# Patient Record
Sex: Female | Born: 1985 | Race: Black or African American | Hispanic: No | Marital: Single | Smoking: Current every day smoker
Health system: Southern US, Community
[De-identification: ages and names within clinical notes are randomized; demographics above are authoritative.]

## PROBLEM LIST (undated history)

## (undated) ENCOUNTER — Emergency Department (HOSPITAL_COMMUNITY): Admission: EM | Payer: Self-pay | Source: Home / Self Care

## (undated) DIAGNOSIS — I10 Essential (primary) hypertension: Secondary | ICD-10-CM

## (undated) DIAGNOSIS — Q211 Atrial septal defect, unspecified: Secondary | ICD-10-CM

## (undated) DIAGNOSIS — F419 Anxiety disorder, unspecified: Secondary | ICD-10-CM

## (undated) DIAGNOSIS — F319 Bipolar disorder, unspecified: Secondary | ICD-10-CM

## (undated) HISTORY — DX: Atrial septal defect, unspecified: Q21.10

## (undated) HISTORY — DX: Atrial septal defect: Q21.1

## (undated) HISTORY — PX: ASD REPAIR: SHX258

---

## 2009-03-28 ENCOUNTER — Encounter: Payer: Self-pay | Admitting: Cardiology

## 2009-03-28 ENCOUNTER — Ambulatory Visit: Payer: Self-pay | Admitting: Cardiology

## 2009-04-03 ENCOUNTER — Ambulatory Visit: Payer: Self-pay | Admitting: Cardiology

## 2009-04-03 ENCOUNTER — Encounter: Payer: Self-pay | Admitting: Cardiology

## 2009-04-03 ENCOUNTER — Ambulatory Visit (HOSPITAL_COMMUNITY): Admission: RE | Admit: 2009-04-03 | Discharge: 2009-04-03 | Payer: Self-pay | Admitting: Cardiology

## 2009-04-06 ENCOUNTER — Encounter: Payer: Self-pay | Admitting: Cardiology

## 2009-04-06 ENCOUNTER — Ambulatory Visit: Payer: Self-pay | Admitting: Cardiovascular Disease

## 2009-04-06 ENCOUNTER — Ambulatory Visit (HOSPITAL_COMMUNITY): Admission: RE | Admit: 2009-04-06 | Discharge: 2009-04-06 | Payer: Self-pay | Admitting: Cardiology

## 2009-04-18 DIAGNOSIS — Z8679 Personal history of other diseases of the circulatory system: Secondary | ICD-10-CM

## 2009-04-24 ENCOUNTER — Ambulatory Visit: Payer: Self-pay | Admitting: Cardiology

## 2010-04-02 ENCOUNTER — Telehealth (INDEPENDENT_AMBULATORY_CARE_PROVIDER_SITE_OTHER): Payer: Self-pay

## 2010-12-10 NOTE — Progress Notes (Signed)
Summary: Pre-Medicated for Dental Work  Phone Note Call from Patient   Caller: Patient Summary of Call: pt needs someone to call her dentist [facility] @ [phone]) to let them know if she needs to be pre-medicated prior to dental procedure/tg Initial call taken by: Raechel Ache [facility],  Apr 02, 2010 10:47 AM  Follow-up for Phone Call        Will pt. need to be pre-medicated before dental procedures. She has HX of possible ASD repair at age 25. Please advise. Follow-up by: Larita Fife Via LPN,  Apr 02, 2010 2:30 PM  Additional Follow-up for Phone Call Additional follow up Details #1::        Yes. Additional Follow-up by: Gaylord Shih, MD, [facility],  Apr 02, 2010 5:25 PM     Appended Document: Pre-Medicated for Dental Work    Phone Note Outgoing Call   Summary of Call: Pt. and [facility] notified that pt. does need to be medicated before dental procedures.

## 2011-03-25 NOTE — Assessment & Plan Note (Signed)
[facility]                       [place] CARDIOLOGY OFFICE NOTE   NAME:Metheny, ASHLEN KIGER                     MRN:          [id number]  DATE:04/24/2009                            DOB:          02-14-1986    CARDIOLOGIST:  Dr. Valera Castle   OBSTETRICIAN:  Dr. Ruthy Dick   REASON FOR VISIT:  Follow-up on abnormal echocardiogram.   HISTORY OF PRESENT ILLNESS:  Ms. Hilmes is a 25 year old female patient  at about 6 weeks of gestation who saw Dr. Daleen Squibb initially on Mar 28, 2009 for follow-up on possible atrial septal defect repair at age [age].  She had an echocardiogram performed.  This demonstrated EF of 50-55%.  She had slightly paradoxical motion of the proximal ventricular septum.  There was also possible modest flow across the mid atrial septum.  Her  peak pulmonary pressure was 32 mmHg.  She was set up for a follow-up  echocardiogram with saline contrast.  This was read by Dr. Eden Emms and  demonstrated no evidence of right-to-left shunt.  She returns in follow-  up.  She is overall doing well.  She has occasional dyspepsia.  She  denies any syncope.  She has some slight shortness of breath with  exertion.  She denies orthopnea or PND.   CURRENT MEDICATIONS:  Ferrous sulfate 325 mg daily.   PHYSICAL EXAMINATION:  GENERAL:  She is a well-nourished well-developed  female at [redacted] weeks gestation.  VITAL SIGNS:  Blood pressure is 118/66, pulse 64, weight 203 pounds.  HEENT:  Normal.  NECK:  Without JVD.  CARDIAC:  S1, S2, regular rate and rhythm with occasional premature  beats.  LUNGS:  Clear to auscultation bilaterally.  ABDOMEN:  Notable for intrauterine pregnancy.  EXTREMITIES:  Without edema.   STUDIES:  Rhythm strip sinus rhythm with heart rate of 93, occasional  PACs.   ASSESSMENT/PLAN:  A 25 year old female with a history of possible ASD  repair at age 78 with recent saline contrast echocardiogram demonstrating  no right-to-left  shunt.  She requires no  further cardiac workup at this time.  She was also interviewed and  examined by Dr. Daleen Squibb.  She can follow up with cardiology on a p.r.n.  basis.      Tereso Newcomer, PA-C  Electronically Signed      Jesse Sans. Daleen Squibb, MD, [facility]  Electronically Signed   SW/MedQ  DD: 04/24/2009  DT: 04/24/2009  Job #: [id number]   cc:   Ruthy Dick

## 2011-03-25 NOTE — Assessment & Plan Note (Signed)
[facility]                       [place] CARDIOLOGY OFFICE NOTE   NAME:Jamie Huber, Jamie Huber                       MRN:          [id number]  DATE:03/28/2009                            DOB:          March 01, 1986    CHIEF COMPLAINT:  Need a cardiac evaluation for a history of surgery at  25 years of age.   HISTORY OF PRESENT ILLNESS:  Jamie Huber is a 25 year old [place]  American female who comes in today for the evaluation of the above  concern.   She is [redacted] weeks pregnant.  She has had a very uneventful pregnancy thus  far with no problems with her blood pressure, no palpitations or  irregularity to her heart rhythm, no lower extremity edema, and no  significant shortness of breath or dyspnea.   She gives a history of having a hole closed in her heart at 25 years of  age at [facility].  I have no operative report or  details.  She cannot remember if it was an atrial septal defect,  ventricular septal defect, or some sort of valve defect.   She has had no problems since then.  She had a normal childhood and had  no problems keeping up with her peers physically.   Her past medical history significant for no known drug allergies.  She  is currently on vitamins with iron.  She does not smoke or drink.   She has had no repeated hospitalizations or any other surgeries.   Socially, she is a Consulting civil engineer.  She is pregnant as mentioned above.  She is  not married.   FAMILY HISTORY:  Father, mother, and brother are alive and well.   REVIEW OF SYSTEMS:  She does wear eye glasses.  She has had some dental  repair, has a couple of cavities at present.  She does not wear any  false teeth.  She has occasional palpitations but these are rare.  Every  other review of systems are negative.  Please refer our diagnostic  evaluation form.   PHYSICAL EXAMINATION:  GENERAL:  Pleasant overweight black female in no  acute distress.  She is alert and  oriented x3.  VITAL SIGNS:  She 5 feet 7 inches, weighs 196 pounds.  Her blood is  pressure is 122/78 in the left arm, pulse is 88 and regular.  Her  electrocardiogram shows sinus rhythm with diffuse T-wave inversion  across the anterior septum and lateral leads.  Her PR, QRS, and QTs are  normal.  She is in sinus rhythm.  SKIN:  Warm and dry.  Unremarkable.  HEENT:  Normal.  NECK:  Supple.  Carotid upstrokes were equal bilaterally without bruits.  No JVD.  Thyroid is not enlarged.  Trachea is midline.  CHEST:  Lungs are clear to auscultation and percussion.  Heart reveals a  nondisplaced PMI.  She has a normal S1 and S2.  No obvious murmur, rub,  or gallop.  There is no right ventricular lift.  ABDOMEN:  Soft, good bowel sounds.  No midline bruit.  No hepatomegaly.  EXTREMITIES:  There were no cyanosis,  clubbing, or edema.  Pulses are  intact.  NEUROLOGIC:  Intact.   ASSESSMENT AND PLAN:  Ms. Lenis sounds like she probably had an atrial  septal defect or vascular septal defect repair.  It would probably be  impossible to get her medical records from [facility] having done such a long  time ago.  I think for now we need to do a 2-D echocardiogram to rule  out any structural heart disease or chamber enlargement.  We will also  look to see if she has any shunt.  If she does not, and is normal, we  will see her back on a p.r.n. basis.     Thomas C. Daleen Squibb, MD, [facility]  Electronically Signed    TCW/MedQ  DD: 03/28/2009  DT: 03/29/2009  Job #: [id number]   cc:   Ruthy Dick

## 2011-11-05 ENCOUNTER — Encounter: Payer: Self-pay | Admitting: Cardiology

## 2014-08-01 ENCOUNTER — Encounter (HOSPITAL_COMMUNITY): Payer: Self-pay | Admitting: Emergency Medicine

## 2014-08-01 ENCOUNTER — Emergency Department (HOSPITAL_COMMUNITY)
Admission: EM | Admit: 2014-08-01 | Discharge: 2014-08-02 | Disposition: A | Discharge status: Other Acute Inpatient Hospital | Payer: Self-pay | Attending: Emergency Medicine | Admitting: Emergency Medicine

## 2014-08-01 DIAGNOSIS — F319 Bipolar disorder, unspecified: Secondary | ICD-10-CM | POA: Insufficient documentation

## 2014-08-01 DIAGNOSIS — F29 Unspecified psychosis not due to a substance or known physiological condition: Secondary | ICD-10-CM | POA: Insufficient documentation

## 2014-08-01 DIAGNOSIS — R4689 Other symptoms and signs involving appearance and behavior: Secondary | ICD-10-CM

## 2014-08-01 DIAGNOSIS — F17200 Nicotine dependence, unspecified, uncomplicated: Secondary | ICD-10-CM | POA: Insufficient documentation

## 2014-08-01 DIAGNOSIS — F331 Major depressive disorder, recurrent, moderate: Secondary | ICD-10-CM

## 2014-08-01 HISTORY — DX: Bipolar disorder, unspecified: F31.9

## 2014-08-01 HISTORY — DX: Anxiety disorder, unspecified: F41.9

## 2014-08-01 LAB — COMPREHENSIVE METABOLIC PANEL
ALT: 14 U/L (ref 0–35)
ANION GAP: 10 (ref 5–15)
AST: 13 U/L (ref 0–37)
Albumin: 4.3 g/dL (ref 3.5–5.2)
Alkaline Phosphatase: 64 U/L (ref 39–117)
BILIRUBIN TOTAL: 0.3 mg/dL (ref 0.3–1.2)
BUN: 6 mg/dL (ref 6–23)
CHLORIDE: 104 meq/L (ref 96–112)
CO2: 29 meq/L (ref 19–32)
CREATININE: 0.81 mg/dL (ref 0.50–1.10)
Calcium: 9.3 mg/dL (ref 8.4–10.5)
GFR calc Af Amer: >90 mL/min (ref >90)
Glucose, Bld: 103 mg/dL — ABNORMAL HIGH (ref 70–99)
Potassium: 4.3 mEq/L (ref 3.7–5.3)
Sodium: 143 mEq/L (ref 137–147)
Total Protein: 7.6 g/dL (ref 6.0–8.3)

## 2014-08-01 LAB — CBC WITH DIFFERENTIAL/PLATELET
BASOS PCT: 0 % (ref 0–1)
Basophils Absolute: 0 10*3/uL (ref 0.0–0.1)
EOS PCT: 0 % (ref 0–5)
Eosinophils Absolute: 0 10*3/uL (ref 0.0–0.7)
HEMATOCRIT: 38.8 % (ref 36.0–46.0)
HEMOGLOBIN: 13.3 g/dL (ref 12.0–15.0)
LYMPHS PCT: 32 % (ref 12–46)
Lymphs Abs: 2.5 10*3/uL (ref 0.7–4.0)
MCH: 30.6 pg (ref 26.0–34.0)
MCHC: 34.3 g/dL (ref 30.0–36.0)
MCV: 89.4 fL (ref 78.0–100.0)
MONO ABS: 0.5 10*3/uL (ref 0.1–1.0)
MONOS PCT: 6 % (ref 3–12)
NEUTROS ABS: 4.7 10*3/uL (ref 1.7–7.7)
Neutrophils Relative %: 62 % (ref 43–77)
Platelets: 180 10*3/uL (ref 150–400)
RBC: 4.34 MIL/uL (ref 3.87–5.11)
RDW: 11.7 % (ref 11.5–15.5)
WBC: 7.8 10*3/uL (ref 4.0–10.5)

## 2014-08-01 LAB — URINALYSIS, ROUTINE W REFLEX MICROSCOPIC
Bilirubin Urine: NEGATIVE
Glucose, UA: NEGATIVE mg/dL
Hgb urine dipstick: NEGATIVE
Ketones, ur: NEGATIVE mg/dL
LEUKOCYTES UA: NEGATIVE
NITRITE: NEGATIVE
PH: 6 (ref 5.0–8.0)
Protein, ur: NEGATIVE mg/dL
SPECIFIC GRAVITY, URINE: 1.025 (ref 1.005–1.030)
Urobilinogen, UA: 0.2 mg/dL (ref 0.0–1.0)

## 2014-08-01 LAB — RAPID URINE DRUG SCREEN, HOSP PERFORMED
Amphetamines: NOT DETECTED
Barbiturates: NOT DETECTED
Benzodiazepines: NOT DETECTED
Cocaine: NOT DETECTED
Opiates: NOT DETECTED
Tetrahydrocannabinol: NOT DETECTED

## 2014-08-01 LAB — PREGNANCY, URINE: PREG TEST UR: NEGATIVE

## 2014-08-01 LAB — ETHANOL

## 2014-08-01 NOTE — ED Notes (Signed)
Neurosurgeon at bedside; officer placed patient in handcuffs; patient respectful to Technical sales engineer.

## 2014-08-01 NOTE — ED Provider Notes (Signed)
CSN: [id number]     Arrival date & time 08/01/14  1709 History   First MD Initiated Contact with Patient 08/01/14 1725     Chief Complaint  Patient presents with  . V70.1     (Consider location/radiation/quality/duration/timing/severity/associated sxs/prior Treatment) HPI Comments: Patient brought in by [facility] Department with IVC papers. Questionable history of bipolar disorder and noncompliance with medications. States she was found with her parents and threatened to kill them. Patient is angry over custody issues with her child. She denies any suicidal or homicidal thoughts. She denies any hallucinations. She admits to being angry with her parents. She is not taking her medications for more than a year. Denies any fever, chills, nausea vomiting. Denies any physical injury. Denies illicit drug or alcohol use.  The history is provided by the patient and the police.    Past Medical History  Diagnosis Date  . Atrial septal defect   . Manic depression   . Anxiety    Past Surgical History  Procedure Laterality Date  . Asd repair     History reviewed. No pertinent family history. History  Substance Use Topics  . Smoking status: Current Every Day Smoker  . Smokeless tobacco: Not on file  . Alcohol Use: No   OB History   Grav Para Term Preterm Abortions TAB SAB Ect Mult Living                 Review of Systems  Constitutional: Negative for fever, activity change and appetite change.  Eyes: Negative for visual disturbance.  Respiratory: Negative for cough, chest tightness and shortness of breath.   Cardiovascular: Negative for chest pain.  Gastrointestinal: Negative for nausea, vomiting and abdominal pain.  Genitourinary: Negative for dysuria and hematuria.  Musculoskeletal: Negative for arthralgias, back pain and myalgias.  Skin: Negative for rash.  Psychiatric/Behavioral: Positive for behavioral problems, dysphoric mood and agitation. Negative for suicidal ideas, sleep  disturbance and self-injury. The patient is not nervous/anxious.   A complete 10 system review of systems was obtained and all systems are negative except as noted in the HPI and PMH.      Allergies  Review of patient's allergies indicates no known allergies.  Home Medications   Prior to Admission medications   Medication Sig Start Date End Date Taking? Authorizing Provider  ferrous sulfate 325 (65 FE) MG tablet Take 325 mg by mouth daily with breakfast.     Yes Historical Provider, MD   BP 109/88  Pulse 97  Temp(Src) 98.7 F (37.1 C) (Oral)  Resp 18  Ht  (1.702 m)  Wt 185 lb (83.915 kg)  BMI 28.97 kg/m2  SpO2 100% Physical Exam  Nursing note and vitals reviewed. Constitutional: She is oriented to person, place, and time. She appears well-developed and well-nourished. No distress.  HENT:  Head: Normocephalic and atraumatic.  Mouth/Throat: Oropharynx is clear and moist. No oropharyngeal exudate.  Eyes: Conjunctivae and EOM are normal. Pupils are equal, round, and reactive to light.  Neck: Normal range of motion. Neck supple.  No meningismus.  Cardiovascular: Normal rate, regular rhythm, normal heart sounds and intact distal pulses.   No murmur heard. Pulmonary/Chest: Effort normal and breath sounds normal. No respiratory distress.  Abdominal: Soft. There is no tenderness. There is no rebound and no guarding.  Musculoskeletal: Normal range of motion. She exhibits no edema and no tenderness.  Neurological: She is alert and oriented to person, place, and time. No cranial nerve deficit. She exhibits normal muscle tone.  Coordination normal.  No ataxia on finger to nose bilaterally. No pronator drift. 5/5 strength throughout. CN 2-12 intact. Negative Romberg. Equal grip strength. Sensation intact. Gait is normal.   Skin: Skin is warm.  Psychiatric: She has a normal mood and affect. Her behavior is normal.    ED Course  Procedures (including critical care time) Labs  Review Labs Reviewed  COMPREHENSIVE METABOLIC PANEL - Abnormal; Notable for the following:    Glucose, Bld 103 (*)    All other components within normal limits  CBC WITH DIFFERENTIAL  ETHANOL  URINE RAPID DRUG SCREEN (HOSP PERFORMED)  PREGNANCY, URINE  URINALYSIS, ROUTINE W REFLEX MICROSCOPIC    Imaging Review No results found.   EKG Interpretation None      MDM   Final diagnoses:  None   patient brought in by [facility] with IVC paperwork for behavior problem and agitation. She is calm and cooperative.  Labs unremarkable. Patient stable for psychiatric evaluation and medically clear. Holding orders placed.    Glynn Octave, MD 08/01/14 2130

## 2014-08-01 NOTE — ED Notes (Signed)
Patient was closing curtain, sitter opened it and patient closed it again. I was notified and went to talk to patient and to open her curtain. She became loud and very vocal stating that the patient that was in there before had their curtain closed. I explained to her that the patient in here before her, was a medical patient. I also explained to her that our sitters needed to have her within their site. Patient states that "they can see my legs", I explained to her that was not adequate. Patient states that I was being belligerent and she was going to call my boss. I explained to her that she could not close the curtain, and we had moved her from the hallway to the room as a courtesy. Patient very argumentative. States that she is "28 years old", "we can't hold her here against her will", states that she has rights as a patient "it's your fault that I have not had my assessment", explained to patient that she was going to have a face to face evaluation. Security at bedside, they called the sheriff's department.

## 2014-08-01 NOTE — ED Notes (Signed)
Patient sent here with [facility] Department with IVC papers. Papers state patient is noncompliant with her bipolar medication. States she is violent with her parents and has threatened to kill them. Patient states this happened a year ago. States "I talk a lot of shit to my parents and they can't handle it. I can only take so much before I talk shit back to them. I don't take my medication because it makes me feel as if I have no control." Patient cooperative and calm in triage.

## 2014-08-01 NOTE — ED Notes (Signed)
Officer states pt has been very calm, Called to be released from sitting, Charge aware. Officer states seems to be a battle over parents wanting to take the custody of the child. Pt states she has to live there now and parents are trying to take custody of her child, Father's name is not on birth cert, so they want custody and not let child go into foster care. Pt states she does not take her medication because she feels like she does not them.

## 2014-08-01 NOTE — ED Notes (Signed)
Assumed care of patient at this time. Sitter at the bedside at this time. Labs and urine ordered. Pt changed into paper scrubs. Belongings searched and place in belongings bag and labels and secured in nursing station. Security at bedside to wand pt. Officer at the bedside.

## 2014-08-01 NOTE — BH Assessment (Signed)
Per Donell Sievert, PA patient meets criteria for inpatient hospitalization.  Patient accepted to [facility] Bed 300-2.  Writer informed the ER MD (Dr.Cook) and the nurse working with the patient. The nurse will arrange transportation with the [facility].

## 2014-08-02 DIAGNOSIS — F331 Major depressive disorder, recurrent, moderate: Secondary | ICD-10-CM

## 2014-08-02 NOTE — BHH Suicide Risk Assessment (Signed)
Suicide Risk Assessment  Discharge Assessment     Demographic Factors:  Low socioeconomic status and Unemployed  Total Time spent with patient: 45 minutes  Psychiatric Specialty Exam:     Blood pressure 118/68, pulse 77, temperature 99.3 F (37.4 C), temperature source Oral, resp. rate 16, height  (1.702 m), weight 83.915 kg (185 lb), SpO2 100.00%.Body mass index is 28.97 kg/(m^2).  General Appearance: Casual  Eye Contact::  Good  Speech:  Clear and Coherent  Volume:  Normal  Mood:  Irritable  Affect:  Appropriate  Thought Process:  Coherent and Logical  Orientation:  Full (Time, Place, and Person)  Thought Content:  Negative  Suicidal Thoughts:  No  Homicidal Thoughts:  No  Memory:  Immediate;   Good Recent;   Good Remote;   Good  Judgement:  Intact  Insight:  Fair  Psychomotor Activity:  Normal  Concentration:  Good  Recall:  Good  Fund of Knowledge:Good  Language: Good  Akathisia:  Negative  Handed:  Right  AIMS (if indicated):     Assets:  Communication Skills Desire for Improvement Housing Physical Health  Sleep:       Musculoskeletal: Strength & Muscle Tone: within normal limits Gait & Station: normal Patient leans: N/A   Mental Status Per Nursing Assessment::   On Admission:     Current Mental Status by Physician: NA  Loss Factors: NA  Historical Factors: NA  Risk Reduction Factors:   NA  Continued Clinical Symptoms:  Bipolar Disorder:   Depressive phase  Cognitive Features That Contribute To Risk:  none   Suicide Risk:  Minimal: No identifiable suicidal ideation.  Patients presenting with no risk factors but with morbid ruminations; may be classified as minimal risk based on the severity of the depressive symptoms  Discharge Diagnoses:   AXIS I:  Adjustment Disorder with Mixed Emotional Features AXIS II:  Deferred AXIS III:   Past Medical History  Diagnosis Date  . Atrial septal defect   . Manic depression   . Anxiety     AXIS IV:  economic problems, housing problems, other psychosocial or environmental problems and problems with primary support group AXIS V:  61-70 mild symptoms  Plan Of Care/Follow-up recommendations:  Activity:  resume usual activity Diet:  resume usual diet  Is patient on multiple antipsychotic therapies at discharge:  No   Has Patient had three or more failed trials of antipsychotic monotherapy by history:  No  Recommended Plan for Multiple Antipsychotic Therapies: NA    TAYLOR,GERALD D 08/02/2014, 12:43 PM

## 2014-08-02 NOTE — BH Assessment (Signed)
Dr. Ladona Ridgel and Assunta Found, NP recommend discharge home.  Patient is ready for discharge, however; has no transportation back to [facility] where she resides. Per patient's nurse [facility] brought patient here from [facility]. They have a responsibility to transport patient back to [facility] and agreeable to do so but not until tommorow.  Writer has made calls to LCSW [phone] to assist with this situation. Writer unable to reach a LCSW. ED secretary contacted to verify if LCSW is available to assist with getting back to [facility] so that patient doesn't have to remain in the ED overnight. Misty Stanley will contact LCSW department to assist with patient's needs.

## 2014-08-02 NOTE — ED Notes (Signed)
Bed: [facility] Expected date: 08/01/14 Expected time: 11:49 PM Means of arrival:  Comments: [facility] transfer

## 2014-08-02 NOTE — Consult Note (Signed)
[facility] Face-to-Face Psychiatry Consult   Reason for Consult:  Involuntarily committed for reportedly threatening her parents Referring Physician:  ER MD   Jamie Huber is an 28 y.o. female. Total Time spent with patient: 45 minutes  Assessment: AXIS I:  major depression recurrent moderate AXIS II:  Deferred AXIS III:   Past Medical History  Diagnosis Date  . Atrial septal defect   . Manic depression   . Anxiety    AXIS IV:  problems with primary support group AXIS V:  61-70 mild symptoms  Plan:  No evidence of imminent risk to self or others at present.    Subjective:   Jamie Huber is a 28 y.o. female patient admitted with being accused by her parents of threatening to kill them she says.  HPI:  Jamie Huber says she and her parents all have anger issues.  She admits hers but her mother in particular does not admit her problems.  They all say things to each other in anger but none plan to kill any body else.  She says she would never kill herself or anybody else.  She has a 43 year old daughter whom she loves and is trying to rear right.  She has to live with her parents right now and there is daily friction.  She says she has been diagnosed with bipolar disorder but the meds she was on did not help.  She has been in therapy and that did not help either. HPI Elements:   Location:  depression . Quality:  mainly stress not suicidal. Severity:  constant discord with her parents she has to live with for now. Timing:  arguments with parents. Duration:  months. Context:  as above.  Past Psychiatric History: Past Medical History  Diagnosis Date  . Atrial septal defect   . Manic depression   . Anxiety     reports that she has been smoking.  She does not have any smokeless tobacco history on file. She reports that she does not drink alcohol or use illicit drugs. History reviewed. No pertinent family history. Family History Substance Abuse: No Family Supports: Yes, List: Living  Arrangements: Parent Can pt return to current living arrangement?: Yes   Allergies:  No Known Allergies  ACT Assessment Complete:  Yes:    Educational Status    Risk to Self: Risk to self with the past 6 months Suicidal Ideation: No Suicidal Intent: No Is patient at risk for suicide?: No Suicidal Plan?: No Access to Means: No What has been your use of drugs/alcohol within the last 12 months?: NA Previous Attempts/Gestures: No How many times?: 0 Other Self Harm Risks: None Reported Triggers for Past Attempts:  (NA) Intentional Self Injurious Behavior: None Family Suicide History: No Recent stressful life event(s): Job Loss;Financial Problems;Conflict (Comment) (Strained relationship with her parents.) Persecutory voices/beliefs?: Yes Depression: Yes Depression Symptoms: Despondent;Fatigue;Feeling worthless/self pity;Feeling angry/irritable;Loss of interest in usual pleasures Substance abuse history and/or treatment for substance abuse?: No Suicide prevention information given to non-admitted patients: Not applicable  Risk to Others: Risk to Others within the past 6 months Homicidal Ideation: Yes-Currently Present Thoughts of Harm to Others: Yes-Currently Present Comment - Thoughts of Harm to Others: Mother reports that she threatened to kill her (Patient denies wanting to kill parents) Current Homicidal Intent: No Current Homicidal Plan: No Access to Homicidal Means: No Identified Victim: None Reported History of harm to others?: No Assessment of Violence: None Noted Violent Behavior Description: None Reported Does patient have  access to weapons?: No Criminal Charges Pending?: No Does patient have a court date: No  Abuse:    Prior Inpatient Therapy: Prior Inpatient Therapy Prior Inpatient Therapy: Yes Prior Therapy Dates: 2014 Prior Therapy Facilty/Provider(s): Unable to remember the name of the hospital Reason for Treatment: Depression  Prior Outpatient Therapy: Prior  Outpatient Therapy Prior Outpatient Therapy: No Prior Therapy Dates: NA Prior Therapy Facilty/Provider(s): NA Reason for Treatment: Medication Management and OPT  Additional Information: Additional Information 1:1 In Past 12 Months?: No CIRT Risk: No Elopement Risk: No Does patient have medical clearance?: Yes                  Objective: Blood pressure 118/68, pulse 77, temperature 99.3 F (37.4 C), temperature source Oral, resp. rate 16, height 5' 7"  (1.702 m), weight 83.915 kg (185 lb), SpO2 100.00%.Body mass index is 28.97 kg/(m^2). Results for orders placed during the hospital encounter of 08/01/14 (from the past 72 hour(s))  URINE RAPID DRUG SCREEN (HOSP PERFORMED)     Status: None   Collection Time    08/01/14  5:52 PM      Result Value Ref Range   Opiates NONE DETECTED  NONE DETECTED   Cocaine NONE DETECTED  NONE DETECTED   Benzodiazepines NONE DETECTED  NONE DETECTED   Amphetamines NONE DETECTED  NONE DETECTED   Tetrahydrocannabinol NONE DETECTED  NONE DETECTED   Barbiturates NONE DETECTED  NONE DETECTED   Comment:            DRUG SCREEN FOR MEDICAL PURPOSES     ONLY.  IF CONFIRMATION IS NEEDED     FOR ANY PURPOSE, NOTIFY LAB     WITHIN 5 DAYS.                LOWEST DETECTABLE LIMITS     FOR URINE DRUG SCREEN     Drug Class       Cutoff (ng/mL)     Amphetamine      1000     Barbiturate      200     Benzodiazepine   423     Tricyclics       536     Opiates          300     Cocaine          300     THC              50  PREGNANCY, URINE     Status: None   Collection Time    08/01/14  5:52 PM      Result Value Ref Range   Preg Test, Ur NEGATIVE  NEGATIVE  URINALYSIS, ROUTINE W REFLEX MICROSCOPIC     Status: None   Collection Time    08/01/14  5:52 PM      Result Value Ref Range   Color, Urine YELLOW  YELLOW   APPearance CLEAR  CLEAR   Specific Gravity, Urine 1.025  1.005 - 1.030   pH 6.0  5.0 - 8.0   Glucose, UA NEGATIVE  NEGATIVE mg/dL   Hgb  urine dipstick NEGATIVE  NEGATIVE   Bilirubin Urine NEGATIVE  NEGATIVE   Ketones, ur NEGATIVE  NEGATIVE mg/dL   Protein, ur NEGATIVE  NEGATIVE mg/dL   Urobilinogen, UA 0.2  0.0 - 1.0 mg/dL   Nitrite NEGATIVE  NEGATIVE   Leukocytes, UA NEGATIVE  NEGATIVE   Comment: MICROSCOPIC NOT DONE ON URINES WITH NEGATIVE PROTEIN, BLOOD, LEUKOCYTES, NITRITE, OR  GLUCOSE <1000 mg/dL.  CBC WITH DIFFERENTIAL     Status: None   Collection Time    08/01/14  6:12 PM      Result Value Ref Range   WBC 7.8  4.0 - 10.5 K/uL   RBC 4.34  3.87 - 5.11 MIL/uL   Hemoglobin 13.3  12.0 - 15.0 g/dL   HCT 38.8  36.0 - 46.0 %   MCV 89.4  78.0 - 100.0 fL   MCH 30.6  26.0 - 34.0 pg   MCHC 34.3  30.0 - 36.0 g/dL   RDW 11.7  11.5 - 15.5 %   Platelets 180  150 - 400 K/uL   Neutrophils Relative % 62  43 - 77 %   Neutro Abs 4.7  1.7 - 7.7 K/uL   Lymphocytes Relative 32  12 - 46 %   Lymphs Abs 2.5  0.7 - 4.0 K/uL   Monocytes Relative 6  3 - 12 %   Monocytes Absolute 0.5  0.1 - 1.0 K/uL   Eosinophils Relative 0  0 - 5 %   Eosinophils Absolute 0.0  0.0 - 0.7 K/uL   Basophils Relative 0  0 - 1 %   Basophils Absolute 0.0  0.0 - 0.1 K/uL  COMPREHENSIVE METABOLIC PANEL     Status: Abnormal   Collection Time    08/01/14  6:12 PM      Result Value Ref Range   Sodium 143  137 - 147 mEq/L   Potassium 4.3  3.7 - 5.3 mEq/L   Chloride 104  96 - 112 mEq/L   CO2 29  19 - 32 mEq/L   Glucose, Bld 103 (*) 70 - 99 mg/dL   BUN 6  6 - 23 mg/dL   Creatinine, Ser 0.81  0.50 - 1.10 mg/dL   Calcium 9.3  8.4 - 10.5 mg/dL   Total Protein 7.6  6.0 - 8.3 g/dL   Albumin 4.3  3.5 - 5.2 g/dL   AST 13  0 - 37 U/L   ALT 14  0 - 35 U/L   Alkaline Phosphatase 64  39 - 117 U/L   Total Bilirubin 0.3  0.3 - 1.2 mg/dL   GFR calc non Af Amer >90  >90 mL/min   GFR calc Af Amer >90  >90 mL/min   Comment: (NOTE)     The eGFR has been calculated using the CKD EPI equation.     This calculation has not been validated in all clinical situations.      eGFR's persistently <90 mL/min signify possible Chronic Kidney     Disease.   Anion gap 10  5 - 15  ETHANOL     Status: None   Collection Time    08/01/14  6:12 PM      Result Value Ref Range   Alcohol, Ethyl (B) <11  0 - 11 mg/dL   Comment:            LOWEST DETECTABLE LIMIT FOR     SERUM ALCOHOL IS 11 mg/dL     FOR MEDICAL PURPOSES ONLY   Labs are reviewed and are pertinent for no psychiatric issues.  No current facility-administered medications for this encounter.   Current Outpatient Prescriptions  Medication Sig Dispense Refill  . ferrous sulfate 325 (65 FE) MG tablet Take 325 mg by mouth daily with breakfast.          Psychiatric Specialty Exam:     Blood pressure 118/68, pulse 77, temperature 99.3  F (37.4 C), temperature source Oral, resp. rate 16, height 5' 7"  (1.702 m), weight 83.915 kg (185 lb), SpO2 100.00%.Body mass index is 28.97 kg/(m^2).  General Appearance: Casual  Eye Contact::  Good  Speech:  Clear and Coherent  Volume:  Normal  Mood:  Irritable  Affect:  Appropriate  Thought Process:  Coherent and Logical  Orientation:  Full (Time, Place, and Person)  Thought Content:  Negative  Suicidal Thoughts:  No  Homicidal Thoughts:  No  Memory:  Immediate;   Good Recent;   Good Remote;   Good  Judgement:  Good  Insight:  Good  Psychomotor Activity:  Normal  Concentration:  Good  Recall:  Good  Fund of Knowledge:Good  Language: Good  Akathisia:  Negative  Handed:  Right  AIMS (if indicated):     Assets:  Communication Skills Desire for [place] Talents/Skills  Sleep:      Musculoskeletal: Strength & Muscle Tone: within normal limits Gait & Station: normal Patient leans: N/A  Treatment Plan Summary: not suicidal or homicidal or psychotic, consequently no grounds to hold her against her will.  Discharge home to be followed outpatient  Aaniya Sterba D 08/02/2014 12:24 PM

## 2014-08-02 NOTE — ED Notes (Signed)
[facility] states no pick up until 9/24. Reported to CSW who is talking to supervisor about a taxi voucher to avoid additional night stay.

## 2014-08-02 NOTE — BH Assessment (Signed)
LCSW-Zack called this Clinical research associate regarding patient's transport issues in returning back to [facility]. Jamie Huber will have a LCSW assist patient.

## 2014-08-02 NOTE — Progress Notes (Signed)
Pt was given [facility] Resource packet. Pt was explained the purpose of the packets. Pt did not have any questions.  

## 2014-08-02 NOTE — BH Assessment (Signed)
Assessment Note  Patient is a 28 year old female that was IVC'd by her parents.  The IVC reports that the IVC papers reports that she is non-compliant with taking her medication.   Patient reports that she has a diagnosis of Bipolar Disorder.  Patient reports that she is noncompliant with her bipolar medication.   Patient reports that she has been violent with her parents in the past.  Patient reports that she and her family members have an anger control problem.   Patient denies threatening to kill her parents.  Documentation on the IVC paperwork reports that the patient did threaten to kill her parents.   Patient reports that "I talk a lot of shit to my parents and they can't handle it. I can only take so much before I talk shit back to them.   Patient reports that she does not take her medication because it makes her feel as if she does not have control.  Patient reports a prior psychiatric hospitalization in 2014 but she does not remember the name of the hospital.    Patient denies physical, sexual or emotional abuse.   Axis I: Major Depression, Recurrent severe Axis II: Deferred Axis III:  Past Medical History  Diagnosis Date  . Atrial septal defect   . Manic depression   . Anxiety    Axis IV: economic problems, housing problems, occupational problems, other psychosocial or environmental problems, problems related to social environment, problems with access to health care services and problems with primary support group Axis V: 31-40 impairment in reality testing  Past Medical History:  Past Medical History  Diagnosis Date  . Atrial septal defect   . Manic depression   . Anxiety     Past Surgical History  Procedure Laterality Date  . Asd repair      Family History: History reviewed. No pertinent family history.  Social History:  reports that she has been smoking.  She does not have any smokeless tobacco history on file. She reports that she does not drink alcohol or use  illicit drugs.  Additional Social History:     CIWA: CIWA-Ar BP: 114/67 mmHg Pulse Rate: 75 COWS:    Allergies: No Known Allergies  Home Medications:  (Not in a hospital admission)  OB/GYN Status:  No LMP recorded. Patient is not currently having periods (Reason: IUD).  General Assessment Data Location of Assessment: [facility] Is this a Tele or Face-to-Face Assessment?: Face-to-Face Is this an Initial Assessment or a Re-assessment for this encounter?: Initial Assessment Living Arrangements: Parent Can pt return to current living arrangement?: Yes Admission Status: Involuntary Is patient capable of signing voluntary admission?: No Transfer from: Home Referral Source: Self/Family/Friend  Medical Screening Exam [facility] Walk-in ONLY) Medical Exam completed: Yes  [facility] Crisis Care Plan Living Arrangements: Parent Name of Psychiatrist: None Reported Name of Therapist: None Reported  Education Status Is patient currently in school?: No Current Grade: NA Highest grade of school patient has completed: NA Name of school: NA Contact person: NA  Risk to self with the past 6 months Suicidal Ideation: No Suicidal Intent: No Is patient at risk for suicide?: No Suicidal Plan?: No Access to Means: No What has been your use of drugs/alcohol within the last 12 months?: NA Previous Attempts/Gestures: No How many times?: 0 Other Self Harm Risks: None Reported Triggers for Past Attempts:  (NA) Intentional Self Injurious Behavior: None Family Suicide History: No Recent stressful life event(s): Job Loss;Financial Problems;Conflict (Comment) (Strained relationship with her  parents.) Persecutory voices/beliefs?: Yes Depression: Yes Depression Symptoms: Despondent;Fatigue;Feeling worthless/self pity;Feeling angry/irritable;Loss of interest in usual pleasures Substance abuse history and/or treatment for substance abuse?: No Suicide prevention information given to non-admitted patients: Not  applicable  Risk to Others within the past 6 months Homicidal Ideation: Yes-Currently Present Thoughts of Harm to Others: Yes-Currently Present Comment - Thoughts of Harm to Others: Mother reports that she threatened to kill her (Patient denies wanting to kill parents) Current Homicidal Intent: No Current Homicidal Plan: No Access to Homicidal Means: No Identified Victim: None Reported History of harm to others?: No Assessment of Violence: None Noted Violent Behavior Description: None Reported Does patient have access to weapons?: No Criminal Charges Pending?: No Does patient have a court date: No  Psychosis Hallucinations: Auditory Delusions: None noted  Mental Status Report Appear/Hygiene: Disheveled Eye Contact: Fair Motor Activity: Freedom of movement Speech: Logical/coherent Level of Consciousness: Alert Mood: Depressed;Anxious;Suspicious Affect: Apprehensive;Anxious Anxiety Level: Minimal Thought Processes: Coherent;Relevant Judgement: Unimpaired Orientation: Person;Place;Time;Situation Obsessive Compulsive Thoughts/Behaviors: None  Cognitive Functioning Concentration: Decreased Memory: Recent Intact;Remote Intact IQ: Average Insight: Fair Impulse Control: Poor Appetite: Fair Weight Loss: 0 Weight Gain: 0 Sleep: Decreased Total Hours of Sleep: 5 Vegetative Symptoms: Decreased grooming  ADLScreening [facility] Assessment Services) Patient's cognitive ability adequate to safely complete daily activities?: Yes Patient able to express need for assistance with ADLs?: Yes Independently performs ADLs?: Yes (appropriate for developmental age)  Prior Inpatient Therapy Prior Inpatient Therapy: Yes Prior Therapy Dates: 2014 Prior Therapy Facilty/Provider(s): Unable to remember the name of the hospital Reason for Treatment: Depression  Prior Outpatient Therapy Prior Outpatient Therapy: No Prior Therapy Dates: NA Prior Therapy Facilty/Provider(s): NA Reason for  Treatment: Medication Management and OPT  ADL Screening (condition at time of admission) Patient's cognitive ability adequate to safely complete daily activities?: Yes Patient able to express need for assistance with ADLs?: Yes Independently performs ADLs?: Yes (appropriate for developmental age)         Values / Beliefs Cultural Requests During Hospitalization: None Spiritual Requests During Hospitalization: None   Advance Directives (For Healthcare) Does patient have an advance directive?: No Would patient like information on creating an advanced directive?: No - patient declined information    Additional Information 1:1 In Past 12 Months?: No CIRT Risk: No Elopement Risk: No Does patient have medical clearance?: Yes     Disposition: Per Donell Sievert patient has been accepted to [facility] Bed 300-2.  Disposition Initial Assessment Completed for this Encounter: Yes Disposition of Patient: Inpatient treatment program Type of inpatient treatment program: Adult  On Site Evaluation by:   Reviewed with Physician:    Phillip Heal LaVerne 08/02/2014 12:10 AM

## 2014-08-02 NOTE — Progress Notes (Signed)
CSW contacted to assist with transportation. Per RN, pt has explored options for transport, but has not family available to provide transport. Pt was brought to psych ED under IVC by [facility] and [facility] stated that they would be unable to pick pt back up until tomorrow.  CSW staffed case with CSW Chiropodist, Wandra Mannan and received approval for cab voucher.  CSW confirmed address with pt and RN provided cab voucher to arrange cab for pt to home.  No further social work needs identified.  CSW signing off.   Loletta Specter, MSW, LCSW Clinical Social Work ED Coverage

## 2014-08-02 NOTE — ED Notes (Signed)
Patient states that there is no family in her area for d/c pick up.

## 2014-08-02 NOTE — BH Assessment (Signed)
Dr. Lovena Le and Earleen Newport, NP recommended discharge home with outpatient follow up. TTS staff Brandi met with patient to discuss outpatient follow up. Patient stating she was seeing a psychiatrist at [facility]. Patient sts she is not happy with their services. Velna Hatchet will seek an alternative outpatient provider for this patient.

## 2015-01-08 ENCOUNTER — Emergency Department (HOSPITAL_COMMUNITY): Payer: MEDICAID

## 2015-01-08 ENCOUNTER — Emergency Department (HOSPITAL_COMMUNITY)
Admission: EM | Admit: 2015-01-08 | Discharge: 2015-01-09 | Disposition: A | Discharge status: Other Acute Inpatient Hospital | Payer: MEDICAID | Attending: Emergency Medicine | Admitting: Emergency Medicine

## 2015-01-08 ENCOUNTER — Encounter (HOSPITAL_COMMUNITY): Payer: Self-pay | Admitting: *Deleted

## 2015-01-08 DIAGNOSIS — Z3202 Encounter for pregnancy test, result negative: Secondary | ICD-10-CM | POA: Insufficient documentation

## 2015-01-08 DIAGNOSIS — R059 Cough, unspecified: Secondary | ICD-10-CM

## 2015-01-08 DIAGNOSIS — R51 Headache: Secondary | ICD-10-CM | POA: Insufficient documentation

## 2015-01-08 DIAGNOSIS — I10 Essential (primary) hypertension: Secondary | ICD-10-CM | POA: Diagnosis not present

## 2015-01-08 DIAGNOSIS — F911 Conduct disorder, childhood-onset type: Secondary | ICD-10-CM | POA: Diagnosis not present

## 2015-01-08 DIAGNOSIS — Z720 Tobacco use: Secondary | ICD-10-CM | POA: Diagnosis not present

## 2015-01-08 DIAGNOSIS — M549 Dorsalgia, unspecified: Secondary | ICD-10-CM | POA: Diagnosis not present

## 2015-01-08 DIAGNOSIS — Z008 Encounter for other general examination: Secondary | ICD-10-CM | POA: Diagnosis present

## 2015-01-08 DIAGNOSIS — J302 Other seasonal allergic rhinitis: Secondary | ICD-10-CM | POA: Diagnosis not present

## 2015-01-08 DIAGNOSIS — R05 Cough: Secondary | ICD-10-CM

## 2015-01-08 DIAGNOSIS — R4689 Other symptoms and signs involving appearance and behavior: Secondary | ICD-10-CM

## 2015-01-08 HISTORY — DX: Essential (primary) hypertension: I10

## 2015-01-08 LAB — URINALYSIS, ROUTINE W REFLEX MICROSCOPIC
Bilirubin Urine: NEGATIVE
Glucose, UA: NEGATIVE mg/dL
Hgb urine dipstick: NEGATIVE
KETONES UR: NEGATIVE mg/dL
NITRITE: NEGATIVE
PROTEIN: NEGATIVE mg/dL
Specific Gravity, Urine: >1.03 — ABNORMAL HIGH (ref 1.005–1.030)
Urobilinogen, UA: 0.2 mg/dL (ref 0.0–1.0)
pH: 6 (ref 5.0–8.0)

## 2015-01-08 LAB — RAPID URINE DRUG SCREEN, HOSP PERFORMED
Amphetamines: NOT DETECTED
BENZODIAZEPINES: NOT DETECTED
Barbiturates: NOT DETECTED
COCAINE: NOT DETECTED
Opiates: NOT DETECTED
TETRAHYDROCANNABINOL: NOT DETECTED

## 2015-01-08 LAB — CBC WITH DIFFERENTIAL/PLATELET
Basophils Absolute: 0 10*3/uL (ref 0.0–0.1)
Basophils Relative: 0 % (ref 0–1)
EOS ABS: 0.1 10*3/uL (ref 0.0–0.7)
EOS PCT: 0 % (ref 0–5)
HCT: 41.2 % (ref 36.0–46.0)
Hemoglobin: 14.1 g/dL (ref 12.0–15.0)
LYMPHS ABS: 4.1 10*3/uL — AB (ref 0.7–4.0)
Lymphocytes Relative: 26 % (ref 12–46)
MCH: 30.5 pg (ref 26.0–34.0)
MCHC: 34.2 g/dL (ref 30.0–36.0)
MCV: 89 fL (ref 78.0–100.0)
Monocytes Absolute: 1.3 10*3/uL — ABNORMAL HIGH (ref 0.1–1.0)
Monocytes Relative: 8 % (ref 3–12)
NEUTROS PCT: 66 % (ref 43–77)
Neutro Abs: 10.6 10*3/uL — ABNORMAL HIGH (ref 1.7–7.7)
PLATELETS: 226 10*3/uL (ref 150–400)
RBC: 4.63 MIL/uL (ref 3.87–5.11)
RDW: 12.2 % (ref 11.5–15.5)
WBC: 16.1 10*3/uL — AB (ref 4.0–10.5)

## 2015-01-08 LAB — BASIC METABOLIC PANEL
Anion gap: 4 — ABNORMAL LOW (ref 5–15)
BUN: 7 mg/dL (ref 6–23)
CALCIUM: 9 mg/dL (ref 8.4–10.5)
CO2: 28 mmol/L (ref 19–32)
Chloride: 107 mmol/L (ref 96–112)
Creatinine, Ser: 0.77 mg/dL (ref 0.50–1.10)
GLUCOSE: 113 mg/dL — AB (ref 70–99)
Potassium: 3.5 mmol/L (ref 3.5–5.1)
Sodium: 139 mmol/L (ref 135–145)

## 2015-01-08 LAB — POC URINE PREG, ED: Preg Test, Ur: NEGATIVE

## 2015-01-08 LAB — ETHANOL

## 2015-01-08 LAB — URINE MICROSCOPIC-ADD ON

## 2015-01-08 NOTE — ED Notes (Signed)
Pt. Calm and cooperative at this time. Pt. Given water.

## 2015-01-08 NOTE — ED Notes (Signed)
EDP at bedside  

## 2015-01-08 NOTE — ED Notes (Signed)
TTS placed at bedside.  
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
## 2015-01-08 NOTE — ED Notes (Signed)
Pt. Smiling when walking though hall, pt. Appears happy. Pt. Reports "I am here because IVC papers" Pt. Denies wanting to hurt herself or anyone else. Pt reports "they are just hard of hearing, I sometimes talk to myself but everyone talks to themselves sometimes" Pt. Denies hallucinations.

## 2015-01-09 ENCOUNTER — Encounter (HOSPITAL_COMMUNITY): Payer: Self-pay

## 2015-01-09 ENCOUNTER — Inpatient Hospital Stay (HOSPITAL_COMMUNITY)
Admission: EM | Admit: 2015-01-09 | Discharge: 2015-01-15 | DRG: 885 | Disposition: A | Discharge status: Home / Self Care | Payer: MEDICAID | Source: Intra-hospital | Attending: Emergency Medicine | Admitting: Emergency Medicine

## 2015-01-09 ENCOUNTER — Other Ambulatory Visit: Payer: Self-pay

## 2015-01-09 DIAGNOSIS — F419 Anxiety disorder, unspecified: Secondary | ICD-10-CM | POA: Diagnosis present

## 2015-01-09 DIAGNOSIS — G4700 Insomnia, unspecified: Secondary | ICD-10-CM | POA: Diagnosis present

## 2015-01-09 DIAGNOSIS — Z9114 Patient's other noncompliance with medication regimen: Secondary | ICD-10-CM | POA: Diagnosis present

## 2015-01-09 DIAGNOSIS — F32A Depression, unspecified: Secondary | ICD-10-CM | POA: Diagnosis present

## 2015-01-09 DIAGNOSIS — F29 Unspecified psychosis not due to a substance or known physiological condition: Secondary | ICD-10-CM

## 2015-01-09 DIAGNOSIS — F250 Schizoaffective disorder, bipolar type: Secondary | ICD-10-CM | POA: Diagnosis present

## 2015-01-09 DIAGNOSIS — I10 Essential (primary) hypertension: Secondary | ICD-10-CM | POA: Diagnosis present

## 2015-01-09 DIAGNOSIS — F319 Bipolar disorder, unspecified: Secondary | ICD-10-CM | POA: Diagnosis present

## 2015-01-09 DIAGNOSIS — R55 Syncope and collapse: Secondary | ICD-10-CM | POA: Diagnosis not present

## 2015-01-09 DIAGNOSIS — F17210 Nicotine dependence, cigarettes, uncomplicated: Secondary | ICD-10-CM | POA: Diagnosis present

## 2015-01-09 DIAGNOSIS — F329 Major depressive disorder, single episode, unspecified: Secondary | ICD-10-CM

## 2015-01-09 DIAGNOSIS — F911 Conduct disorder, childhood-onset type: Secondary | ICD-10-CM | POA: Diagnosis not present

## 2015-01-09 DIAGNOSIS — F209 Schizophrenia, unspecified: Secondary | ICD-10-CM

## 2015-01-09 MED ORDER — HALOPERIDOL 5 MG PO TABS
5.0000 mg | ORAL_TABLET | Freq: Two times a day (BID) | ORAL | Status: DC
  Administered 2015-01-09 – 2015-01-10 (×2): 5 mg via ORAL
  Filled 2015-01-09 (×6): qty 1

## 2015-01-09 MED ORDER — ALUM & MAG HYDROXIDE-SIMETH 200-200-20 MG/5ML PO SUSP: 30.0000 mL | ORAL | Status: DC | PRN

## 2015-01-09 MED ORDER — ACETAMINOPHEN 325 MG PO TABS
650.0000 mg | ORAL_TABLET | Freq: Four times a day (QID) | ORAL | Status: DC | PRN
  Administered 2015-01-10: 650 mg via ORAL

## 2015-01-09 MED ORDER — BENZTROPINE MESYLATE 0.5 MG PO TABS
0.5000 mg | ORAL_TABLET | Freq: Two times a day (BID) | ORAL | Status: DC
  Administered 2015-01-09 – 2015-01-15 (×12): 0.5 mg via ORAL
  Filled 2015-01-09 (×18): qty 1

## 2015-01-09 MED ORDER — OLANZAPINE 5 MG PO TBDP: 5.0000 mg | ORAL_TABLET | Freq: Three times a day (TID) | ORAL | Status: DC | PRN

## 2015-01-09 MED ORDER — MAGNESIUM HYDROXIDE 400 MG/5ML PO SUSP: 30.0000 mL | Freq: Every day | ORAL | Status: DC | PRN

## 2015-01-09 MED ORDER — ENSURE COMPLETE PO LIQD
237.0000 mL | Freq: Two times a day (BID) | ORAL | Status: DC
  Administered 2015-01-10 – 2015-01-12 (×5): 237 mL via ORAL
  Filled 2015-01-09: qty 237

## 2015-01-09 MED ORDER — TRAZODONE HCL 50 MG PO TABS
50.0000 mg | ORAL_TABLET | Freq: Every evening | ORAL | Status: DC | PRN
  Administered 2015-01-11: 50 mg via ORAL
  Filled 2015-01-09: qty 1
  Filled 2015-01-09: qty 4

## 2015-01-09 NOTE — Tx Team (Signed)
Initial Interdisciplinary Treatment Plan   PATIENT STRESSORS: Financial difficulties Marital or family conflict Medication change or noncompliance Traumatic event   PATIENT STRENGTHS: Ability for insight Average or above average intelligence Communication skills Motivation for treatment/growth Supportive family/friends   PROBLEM LIST: Problem List/Patient Goals Date to be addressed Date deferred Reason deferred Estimated date of resolution  "I just want to get over my anxiety issues" 01/09/2015  01/09/2015    "i have sensitive ears, sometimes" - AH 01/09/2015 01/09/2015    "I want to get back to normal work habits, I live with my parents right now" 01/09/2015 01/09/2015                                         DISCHARGE CRITERIA:  Ability to meet basic life and health needs Improved stabilization in mood, thinking, and/or behavior Medical problems require only outpatient monitoring Verbal commitment to aftercare and medication compliance  PRELIMINARY DISCHARGE PLAN: Outpatient therapy Participate in family therapy Return to previous living arrangement  PATIENT/FAMIILY INVOLVEMENT: This treatment plan has been presented to and reviewed with the patient, Jamie Huber.The patient and family have been given the opportunity to ask questions and make suggestions.  Aurora Maskwyman, Breckin Savannah E 01/09/2015, 12:29 PM
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
## 2015-01-09 NOTE — Progress Notes (Signed)
Did not attend group 

## 2015-01-09 NOTE — ED Notes (Signed)
Report called to Maralyn SagoSarah, [facility] fund[facility]N at [facility]. Awaiting [facility] for transport to facility.
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
## 2015-01-09 NOTE — [facility] Suicide Risk Assessment (Signed)
[facility] Ad[facility]   Nursing information obtained from:    Demographic factors:    Current Mental Status:    Loss Factors:    Historical Factors:    Risk Reduction Factors:    Total Time spent with patient: 30 minutes Principal Problem: Depression Diagnosis:   Patient Active Problem List   Diagnosis Date Noted  . Depression [F32.9] 01/09/2015  . Psychosis [F29] 01/09/2015  . ATRIAL SEPTAL DEFECT, HX OF [Z86.79] 04/18/2009     Continued Clinical Symptoms:  Alcohol Use Disorder Identification Test Final Score (AUDIT): 0 The "Alcohol Use Disorders Identification Test", Guidelines for Use in Primary Care, Second Edition.  World Science writerHealth Organization [facility](WHO). Score between 0-7:  no or low risk or alcohol related problems. Score between 8-15:  moderate risk of alcohol related problems. Score between 16-19:  high risk of alcohol related problems. Score 20 or above:  warrants further diagnostic evaluation for alcohol dependence and treatment.   CLINICAL FACTORS:   Currently Psychotic   Musculoskeletal: Strength & Muscle Tone: within normal limits Gait & Station: normal Patient leans: N/A  Psychiatric Specialty Exam: Physical Exam  Review of Systems  Unable to perform ROS: mental acuity    Blood pressure 96/53, pulse 105, temperature 98.6 F (37 C), temperature source Oral, resp. rate 16, height 5' 6.25" (1.683 m), weight 72.576 kg (160 lb).Body mass index is 25.62 kg/(m^2).  General Appearance: Disheveled  Eye SolicitorContact::  Fair  Speech:  Normal Rate  Volume:  Normal  Mood:  Euphoric  Affect:  Inappropriate  Thought Process:  Disorganized  Orientation:  Full (Time, Place, and Person)  Thought Content:  Delusions and Hallucinations: Auditory  Suicidal Thoughts:  No  Homicidal Thoughts:  No  Memory:  Immediate;   Fair Recent;   Poor Remote;   Poor  Judgement:  Impaired  Insight:  Lacking  Psychomotor Activity:  Restlessness  Concentration:  Poor   Recall:  Fair  Fund of Knowledge:Fair  Language: Fair  Akathisia:  No  Handed:  Right  AIMS (if indicated):     Assets:  Others:  access to health care  Sleep:     Cognition: WNL  ADL's:  Intact     COGNITIVE FEATURES THAT CONTRIBUTE TO RISK:  Closed-mindedness, Polarized thinking and Thought constriction (tunnel vision)    SUICIDE RISK:   Moderate:  Frequent suicidal ideation with limited intensity, and duration, some specificity in terms of plans, no associated intent, good self-control, limited dysphoria/symptomatology, some risk factors present, and identifiable protective factors, including available and accessible social support.  PLAN OF CARE: Patient will benefit from inpatient treatment and stabilization.  Estimated length of stay is 5-7 days.  Reviewed past medical records,treatment plan.  Will start a trial of Haldol po. Will make available medications for agitation as well as anxiety. Will consider a mood stabilizer like Depakote once patient is more cooperative. Will continue to monitor vitals ,medication compliance and treatment side effects while patient is here.  Will monitor for medical issues as well as call consult as needed.  Reviewed labs ,will order as needed.  CSW will start working on disposition.  Patient to participate in therapeutic milieu .       Medical Decision Making:  Review of Psycho-Social Stressors (1), Review or order clinical lab tests (1), Discuss test with performing physician (1), Established Problem, Worsening (2), Review of Medication Regimen & Side Effects (2) and Review of New Medication or Change in Dosage (2)  I certify that  inpatient services furnished can reasonably be expected to improve the patient's condition.   Ezreal Turay MD 01/09/2015, 2:47 PM

## 2015-01-09 NOTE — ED Notes (Signed)
TTS experiencing technical difficulties at this time, unable to call in for assessment, BH to contact IT department.

## 2015-01-09 NOTE — ED Notes (Signed)
TTS being completed via phone.

## 2015-01-09 NOTE — Plan of Care (Signed)
Problem: Alteration in thought process Goal: LTG-Patient is able to perceive the environment accurately Outcome: Not Progressing Pt blames others and is paranoid about the intent of others actions

## 2015-01-09 NOTE — ED Notes (Signed)
Called BH for update, continuing to experience technical difficulties with TTS machines.

## 2015-01-09 NOTE — Progress Notes (Signed)
Pt has been in bed since the beginning of the shift.  She was admitted to the unit this morning.  On assessment, pt was asleep, but aroused easily.  Conversation was minimal.  She denies SI/HI/AV.  Pt voiced no needs or concerns at this time.  Pt was encouraged to inform staff if she had concerns.  Pt did not attend group.  Support and encouragement offered.  Safety maintained with q15 minute checks.

## 2015-01-09 NOTE — H&P (Signed)
Psychiatric Admission Assessment Adult  Patient Identification: Jamie Huber MRN:  [id number] Date of Evaluation:  01/09/2015 Chief Complaint:  "I'm just here because my parents needed time away from me."  Principal Diagnosis: Unspecified Schizophrenia Spectrum and Other Psychotic Disorder Rule out Bipolar 1 Disorder  Diagnosis:   Patient Active Problem List   Diagnosis Date Noted  . Depression [F32.9] 01/09/2015  . Psychosis [F29] 01/09/2015  . ATRIAL SEPTAL DEFECT, HX OF [Z86.79] 04/18/2009   History of Present Illness::  Jamie Huber is a 29 year old female who was brought to [facility] under IVC paperwork filed by parents. The paperwork indicated that the patient has a psychotic disorder and stopped her medications eight months ago. Per the IVC paperwork the patient has been pacing at night, locking her parents out of house, and delusional regarding there being monitoring equipment in the home. The patient is also reported to have talked about obtaining a gun permit to harm various relatives with. The patient appears very psychotic during her assessment today and is a poor historian. She stated the following during assessment "There is a lot of drama going on. I wish people would leave me alone. It's my business if I delete people off my face-book account. They think I work for the police or something. I just wish that my mother would stop nagging me. I am trying to do a good job in life." Jamie Huber because tearful when talking about her stressors. She did not have a clear understanding as to why she was admitted to the hospital. The patient was noted to express delusional and paranoid thinking during the assessment. She insisted that her coworkers were standing outside her door stating "Jamie Huber from [employer] is trying to switch his gender to look like me." The patient denied AVH but stated "Sometimes my hearing is just very sensitive." She denies any drug use and UDS is negative. The patient is  difficult to follow during conversation due to her disorganized thoughts. For example when asked last time the patient had taken any medications responded with answer about how it is wrong for people to steal medications. The patient showed poor insight when talking about her need to isolate until she "is ready to be around people." Patient denied any need for there to take mental health medications. She describe times when she was "just very happy, like when it's springtime." Her chart indicates a history of Bipolar Disorder.   Elements:  Location:  [facility] adult unit. Quality:  Mood lability, aggression, psychosis . Severity:  Severe . Timing:  Last few weeks. Duration:  Several years . Context:  off medications, History of Bipolar, conflict with parents. Associated Signs/Symptoms: Depression Symptoms:  depressed mood, anhedonia, insomnia, psychomotor agitation, anxiety, loss of energy/fatigue, disturbed sleep, (Hypo) Manic Symptoms:  Delusions, Distractibility, Flight of Ideas, Irritable Mood, Labiality of Mood, Anxiety Symptoms:  Excessive Worry, Psychotic Symptoms:  Delusions, Paranoia, PTSD Symptoms: NA Total Time spent with patient: 1 hour  Past Medical History:  Past Medical History  Diagnosis Date  . Atrial septal defect   . Manic depression   . Anxiety   . Hypertension     Past Surgical History  Procedure Laterality Date  . Asd repair     Family History: History reviewed. No pertinent family history. Social History:  History  Alcohol Use  . Yes     History  Drug Use No    History   Social History  . Marital Status: Single    Spouse  Name: N/A  . Number of Children: N/A  . Years of Education: N/A   Occupational History  . Student    Social History Main Topics  . Smoking status: Current Every Day Smoker    Types: Cigarettes  . Smokeless tobacco: Not on file  . Alcohol Use: Yes  . Drug Use: No  . Sexual Activity: Yes    Birth Control/ Protection:  IUD   Other Topics Concern  . None   Social History Narrative   Pregnant   Additional Social History:    Pain Medications: None Prescriptions: Pt states she does not take any of her anxiety med (escatalapram, before that Lexapro) Over the Counter: Oregano oil pill, Vitamin C pill History of alcohol / drug use?: No history of alcohol / drug abuse                     Musculoskeletal: Strength & Muscle Tone: within normal limits Gait & Station: normal Patient leans: N/A  Psychiatric Specialty Exam: Physical Exam  Constitutional:  Physical exam findings reviewed from the [facility] on 01/08/15 and I concur with no noted exceptions.     Review of Systems  Constitutional: Negative for fever, chills, weight loss, malaise/fatigue and diaphoresis.  HENT: Negative for congestion, ear discharge, ear pain, hearing loss, nosebleeds, sore throat and tinnitus.   Eyes: Negative for blurred vision, double vision, photophobia, pain, discharge and redness.  Respiratory: Negative for cough, hemoptysis, sputum production, shortness of breath, wheezing and stridor.   Cardiovascular: Negative for chest pain, palpitations, orthopnea, claudication, leg swelling and PND.  Gastrointestinal: Negative for heartburn, nausea, vomiting, abdominal pain, diarrhea, constipation, blood in stool and melena.  Genitourinary: Negative for dysuria, urgency, frequency, hematuria and flank pain.  Musculoskeletal: Negative for myalgias, back pain, joint pain, falls and neck pain.  Skin: Negative for itching and rash.  Neurological: Negative for dizziness, tingling, tremors, sensory change, speech change, focal weakness, seizures, loss of consciousness, weakness and headaches.  Endo/Heme/Allergies: Negative for environmental allergies and polydipsia. Does not bruise/bleed easily.  Psychiatric/Behavioral: Positive for depression. The patient is nervous/anxious and has insomnia.     Blood pressure 96/53, pulse 105,  temperature 98.6 F (37 C), temperature source Oral, resp. rate 16, height 5' 6.25" (1.683 m), weight 72.576 kg (160 lb).Body mass index is 25.62 kg/(m^2).  General Appearance: Casual  Eye Contact::  Good  Speech:  Clear and Coherent  Volume:  Normal  Mood:  Dysphoric  Affect:  Labile  Thought Process:  Disorganized  Orientation:  Full (Time, Place, and Person)  Thought Content:  Paranoid Ideation and Rumination  Suicidal Thoughts:  No  Homicidal Thoughts:  No  Memory:  Immediate;   Fair Recent;   Fair Remote;   Fair  Judgement:  Impaired  Insight:  Lacking  Psychomotor Activity:  Increased  Concentration:  Fair  Recall:  [employer] of Knowledge:Fair  Language: Good  Akathisia:  No  Handed:  Right  AIMS (if indicated):     Assets:  Communication Skills Housing Leisure Time Physical Health Resilience  ADL's:  Intact  Cognition: WNL  Sleep:      Risk to Self: Is patient at risk for suicide?: No Risk to Others:   Prior Inpatient Therapy:   Prior Outpatient Therapy:    Alcohol Screening: Patient refused Alcohol Screening Tool: Yes 1. How often do you have a drink containing alcohol?: Never 9. Have you or someone else been injured as a result of your drinking?: No  10. Has a relative or friend or a doctor or another health worker been concerned about your drinking or suggested you cut down?: No Alcohol Use Disorder Identification Test Final Score (AUDIT): 0 Brief Intervention: AUDIT score less than 7 or less-screening does not suggest unhealthy drinking-brief intervention not indicated  Allergies:  No Known Allergies Lab Results:  Results for orders placed or performed during the hospital encounter of 01/08/15 (from the past 48 hour(s))  Basic metabolic panel     Status: Abnormal   Collection Time: 01/08/15  9:22 PM  Result Value Ref Range   Sodium 139 135 - 145 mmol/L   Potassium 3.5 3.5 - 5.1 mmol/L   Chloride 107 96 - 112 mmol/L   CO2 28 19 - 32 mmol/L   Glucose,  Bld 113 (H) 70 - 99 mg/dL   BUN 7 6 - 23 mg/dL   Creatinine, Ser 0.77 0.50 - 1.10 mg/dL   Calcium 9.0 8.4 - 10.5 mg/dL   GFR calc non Af Amer >90 >90 mL/min   GFR calc Af Amer >90 >90 mL/min    Comment: (NOTE) The eGFR has been calculated using the CKD EPI equation. This calculation has not been validated in all clinical situations. eGFR's persistently <90 mL/min signify possible Chronic Kidney Disease.    Anion gap 4 (L) 5 - 15  CBC with Differential     Status: Abnormal   Collection Time: 01/08/15  9:22 PM  Result Value Ref Range   WBC 16.1 (H) 4.0 - 10.5 K/uL   RBC 4.63 3.87 - 5.11 MIL/uL   Hemoglobin 14.1 12.0 - 15.0 g/dL   HCT 41.2 36.0 - 46.0 %   MCV 89.0 78.0 - 100.0 fL   MCH 30.5 26.0 - 34.0 pg   MCHC 34.2 30.0 - 36.0 g/dL   RDW 12.2 11.5 - 15.5 %   Platelets 226 150 - 400 K/uL   Neutrophils Relative % 66 43 - 77 %   Neutro Abs 10.6 (H) 1.7 - 7.7 K/uL   Lymphocytes Relative 26 12 - 46 %   Lymphs Abs 4.1 (H) 0.7 - 4.0 K/uL   Monocytes Relative 8 3 - 12 %   Monocytes Absolute 1.3 (H) 0.1 - 1.0 K/uL   Eosinophils Relative 0 0 - 5 %   Eosinophils Absolute 0.1 0.0 - 0.7 K/uL   Basophils Relative 0 0 - 1 %   Basophils Absolute 0.0 0.0 - 0.1 K/uL  Ethanol     Status: None   Collection Time: 01/08/15  9:22 PM  Result Value Ref Range   Alcohol, Ethyl (B) <5 0 - 9 mg/dL    Comment:        LOWEST DETECTABLE LIMIT FOR SERUM ALCOHOL IS 11 mg/dL FOR MEDICAL PURPOSES ONLY   Urinalysis, Routine w reflex microscopic     Status: Abnormal   Collection Time: 01/08/15  9:25 PM  Result Value Ref Range   Color, Urine YELLOW YELLOW   APPearance CLEAR CLEAR   Specific Gravity, Urine >1.030 (H) 1.005 - 1.030   pH 6.0 5.0 - 8.0   Glucose, UA NEGATIVE NEGATIVE mg/dL   Hgb urine dipstick NEGATIVE NEGATIVE   Bilirubin Urine NEGATIVE NEGATIVE   Ketones, ur NEGATIVE NEGATIVE mg/dL   Protein, ur NEGATIVE NEGATIVE mg/dL   Urobilinogen, UA 0.2 0.0 - 1.0 mg/dL   Nitrite NEGATIVE NEGATIVE    Leukocytes, UA MODERATE (A) NEGATIVE  Drug screen panel, emergency     Status: None   Collection Time:  01/08/15  9:25 PM  Result Value Ref Range   Opiates NONE DETECTED NONE DETECTED   Cocaine NONE DETECTED NONE DETECTED   Benzodiazepines NONE DETECTED NONE DETECTED   Amphetamines NONE DETECTED NONE DETECTED   Tetrahydrocannabinol NONE DETECTED NONE DETECTED   Barbiturates NONE DETECTED NONE DETECTED    Comment:        DRUG SCREEN FOR MEDICAL PURPOSES ONLY.  IF CONFIRMATION IS NEEDED FOR ANY PURPOSE, NOTIFY LAB WITHIN 5 DAYS.        LOWEST DETECTABLE LIMITS FOR URINE DRUG SCREEN Drug Class       Cutoff (ng/mL) Amphetamine      1000 Barbiturate      200 Benzodiazepine   742 Tricyclics       595 Opiates          300 Cocaine          300 THC              50   Urine microscopic-add on     Status: Abnormal   Collection Time: 01/08/15  9:25 PM  Result Value Ref Range   Squamous Epithelial / LPF MANY (A) RARE   WBC, UA 21-50 <3 WBC/hpf   Bacteria, UA MANY (A) RARE  POC Urine Pregnancy, ED (do NOT order at [facility])     Status: None   Collection Time: 01/08/15  9:33 PM  Result Value Ref Range   Preg Test, Ur NEGATIVE NEGATIVE    Comment:        THE SENSITIVITY OF THIS METHODOLOGY IS >24 mIU/mL    Current Medications: Current Facility-Administered Medications  Medication Dose Route Frequency Provider Last Rate Last Dose  . acetaminophen (TYLENOL) tablet 650 mg  650 mg Oral Q6H PRN Elmarie Shiley, NP      . alum & mag hydroxide-simeth (MAALOX/MYLANTA) 200-200-20 MG/5ML suspension 30 mL  30 mL Oral Q4H PRN Elmarie Shiley, NP      . feeding supplement (ENSURE COMPLETE) (ENSURE COMPLETE) liquid 237 mL  237 mL Oral BID BM Saramma Eappen, MD      . magnesium hydroxide (MILK OF MAGNESIA) suspension 30 mL  30 mL Oral Daily PRN Elmarie Shiley, NP      . traZODone (DESYREL) tablet 50 mg  50 mg Oral QHS PRN Elmarie Shiley, NP       PTA Medications: No prescriptions prior to admission    Previous  Psychotropic Medications: Yes  Reports Prozac, Lexapro, Celexa  Substance Abuse History in the last 12 months:  No.  Consequences of Substance Abuse: Negative  Results for orders placed or performed during the hospital encounter of 01/08/15 (from the past 72 hour(s))  Basic metabolic panel     Status: Abnormal   Collection Time: 01/08/15  9:22 PM  Result Value Ref Range   Sodium 139 135 - 145 mmol/L   Potassium 3.5 3.5 - 5.1 mmol/L   Chloride 107 96 - 112 mmol/L   CO2 28 19 - 32 mmol/L   Glucose, Bld 113 (H) 70 - 99 mg/dL   BUN 7 6 - 23 mg/dL   Creatinine, Ser 0.77 0.50 - 1.10 mg/dL   Calcium 9.0 8.4 - 10.5 mg/dL   GFR calc non Af Amer >90 >90 mL/min   GFR calc Af Amer >90 >90 mL/min    Comment: (NOTE) The eGFR has been calculated using the CKD EPI equation. This calculation has not been validated in all clinical situations. eGFR's persistently <90 mL/min signify possible Chronic Kidney Disease.  Anion gap 4 (L) 5 - 15  CBC with Differential     Status: Abnormal   Collection Time: 01/08/15  9:22 PM  Result Value Ref Range   WBC 16.1 (H) 4.0 - 10.5 K/uL   RBC 4.63 3.87 - 5.11 MIL/uL   Hemoglobin 14.1 12.0 - 15.0 g/dL   HCT 41.2 36.0 - 46.0 %   MCV 89.0 78.0 - 100.0 fL   MCH 30.5 26.0 - 34.0 pg   MCHC 34.2 30.0 - 36.0 g/dL   RDW 12.2 11.5 - 15.5 %   Platelets 226 150 - 400 K/uL   Neutrophils Relative % 66 43 - 77 %   Neutro Abs 10.6 (H) 1.7 - 7.7 K/uL   Lymphocytes Relative 26 12 - 46 %   Lymphs Abs 4.1 (H) 0.7 - 4.0 K/uL   Monocytes Relative 8 3 - 12 %   Monocytes Absolute 1.3 (H) 0.1 - 1.0 K/uL   Eosinophils Relative 0 0 - 5 %   Eosinophils Absolute 0.1 0.0 - 0.7 K/uL   Basophils Relative 0 0 - 1 %   Basophils Absolute 0.0 0.0 - 0.1 K/uL  Ethanol     Status: None   Collection Time: 01/08/15  9:22 PM  Result Value Ref Range   Alcohol, Ethyl (B) <5 0 - 9 mg/dL    Comment:        LOWEST DETECTABLE LIMIT FOR SERUM ALCOHOL IS 11 mg/dL FOR MEDICAL PURPOSES ONLY    Urinalysis, Routine w reflex microscopic     Status: Abnormal   Collection Time: 01/08/15  9:25 PM  Result Value Ref Range   Color, Urine YELLOW YELLOW   APPearance CLEAR CLEAR   Specific Gravity, Urine >1.030 (H) 1.005 - 1.030   pH 6.0 5.0 - 8.0   Glucose, UA NEGATIVE NEGATIVE mg/dL   Hgb urine dipstick NEGATIVE NEGATIVE   Bilirubin Urine NEGATIVE NEGATIVE   Ketones, ur NEGATIVE NEGATIVE mg/dL   Protein, ur NEGATIVE NEGATIVE mg/dL   Urobilinogen, UA 0.2 0.0 - 1.0 mg/dL   Nitrite NEGATIVE NEGATIVE   Leukocytes, UA MODERATE (A) NEGATIVE  Drug screen panel, emergency     Status: None   Collection Time: 01/08/15  9:25 PM  Result Value Ref Range   Opiates NONE DETECTED NONE DETECTED   Cocaine NONE DETECTED NONE DETECTED   Benzodiazepines NONE DETECTED NONE DETECTED   Amphetamines NONE DETECTED NONE DETECTED   Tetrahydrocannabinol NONE DETECTED NONE DETECTED   Barbiturates NONE DETECTED NONE DETECTED    Comment:        DRUG SCREEN FOR MEDICAL PURPOSES ONLY.  IF CONFIRMATION IS NEEDED FOR ANY PURPOSE, NOTIFY LAB WITHIN 5 DAYS.        LOWEST DETECTABLE LIMITS FOR URINE DRUG SCREEN Drug Class       Cutoff (ng/mL) Amphetamine      1000 Barbiturate      200 Benzodiazepine   254 Tricyclics       270 Opiates          300 Cocaine          300 THC              50   Urine microscopic-add on     Status: Abnormal   Collection Time: 01/08/15  9:25 PM  Result Value Ref Range   Squamous Epithelial / LPF MANY (A) RARE   WBC, UA 21-50 <3 WBC/hpf   Bacteria, UA MANY (A) RARE  POC Urine Pregnancy, ED (do NOT order  at [facility])     Status: None   Collection Time: 01/08/15  9:33 PM  Result Value Ref Range   Preg Test, Ur NEGATIVE NEGATIVE    Comment:        THE SENSITIVITY OF THIS METHODOLOGY IS >24 mIU/mL     Observation Level/Precautions:  15 minute checks  Laboratory:  CBC Chemistry Profile UDS UA  Psychotherapy:  Individual and Group Therapy  Medications:  Start Haldol 5 mg BID  for psychosis, Consider addition of Depakote after liver enzymes are checked   Consultations:  As needed  Discharge Concerns:  Safety and Stability, Medication compliance  Estimated LOS: 3-5 days  Other:  Increase collateral information from parents    Psychological Evaluations: Yes   Treatment Plan Summary: Daily contact with patient to assess and evaluate symptoms and progress in treatment and Medication management  Treatment Plan/Recommendations:   1. Admit for crisis management and stabilization. Estimated length of stay 5-7 days. 2. Medication management to reduce current symptoms to base line and improve the patient's level of functioning.  3. Develop treatment plan to decrease risk of relapse upon discharge of depressive symptoms and the need for readmission. 5. Group therapy to facilitate development of healthy coping skills to use for depression and anxiety. 6. Health care follow up as needed for medical problems.  7. Discharge plan to include therapy to help patient cope with stressors.  8. Call for Consult with Hospitalist for additional specialty patient services as needed.   Medical Decision Making:  New problem, with additional work up planned, Review of Last Therapy Session (1), Review or order medicine tests (1) and Review of Medication Regimen & Side Effects (2)  I certify that inpatient services furnished can reasonably be expected to improve the patient's condition.   Elmarie Shiley NP-C 3/1/20162:59 PM

## 2015-01-09 NOTE — ED Notes (Signed)
[facility] at bedside for transport. Vital signs obtained and stable. Patient left department at this time with [facility]. No distress upon departure.
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
## 2015-01-10 DIAGNOSIS — F319 Bipolar disorder, unspecified: Secondary | ICD-10-CM

## 2015-01-10 LAB — COMPREHENSIVE METABOLIC PANEL
ALBUMIN: 4.4 g/dL (ref 3.5–5.2)
ALT: 14 U/L (ref 0–35)
AST: 12 U/L (ref 0–37)
Alkaline Phosphatase: 49 U/L (ref 39–117)
Anion gap: 7 (ref 5–15)
BUN: 8 mg/dL (ref 6–23)
CHLORIDE: 103 mmol/L (ref 96–112)
CO2: 28 mmol/L (ref 19–32)
Calcium: 9.4 mg/dL (ref 8.4–10.5)
Creatinine, Ser: 0.62 mg/dL (ref 0.50–1.10)
GFR calc Af Amer: >90 mL/min (ref >90)
GFR calc non Af Amer: >90 mL/min (ref >90)
Glucose, Bld: 95 mg/dL (ref 70–99)
Potassium: 3.7 mmol/L (ref 3.5–5.1)
Sodium: 138 mmol/L (ref 135–145)
Total Bilirubin: 0.9 mg/dL (ref 0.3–1.2)
Total Protein: 7.6 g/dL (ref 6.0–8.3)

## 2015-01-10 LAB — URINE CULTURE: Colony Count: 75000

## 2015-01-10 LAB — LIPID PANEL
CHOLESTEROL: 119 mg/dL (ref 0–200)
HDL: 41 mg/dL (ref >39)
LDL Cholesterol: 63 mg/dL (ref 0–99)
TRIGLYCERIDES: 77 mg/dL (ref <150)
Total CHOL/HDL Ratio: 2.9 RATIO
VLDL: 15 mg/dL (ref 0–40)

## 2015-01-10 LAB — TSH: TSH: 1.21 u[IU]/mL (ref 0.350–4.500)

## 2015-01-10 MED ORDER — DIVALPROEX SODIUM 500 MG PO DR TAB
500.0000 mg | DELAYED_RELEASE_TABLET | Freq: Two times a day (BID) | ORAL | Status: DC
  Administered 2015-01-10 – 2015-01-14 (×10): 500 mg via ORAL
  Filled 2015-01-10 (×15): qty 1

## 2015-01-10 MED ORDER — HALOPERIDOL 5 MG PO TABS
10.0000 mg | ORAL_TABLET | Freq: Every evening | ORAL | Status: DC
  Administered 2015-01-11 – 2015-01-14 (×4): 10 mg via ORAL
  Filled 2015-01-10 (×6): qty 2

## 2015-01-10 MED ORDER — HALOPERIDOL 5 MG PO TABS
5.0000 mg | ORAL_TABLET | Freq: Every day | ORAL | Status: DC
  Administered 2015-01-11: 5 mg via ORAL
  Filled 2015-01-10 (×3): qty 1

## 2015-01-10 NOTE — [facility] Group Notes (Signed)
[facility] LCSW Group Therapy  01/10/2015 1:42 PM  Type of Therapy:  Group Therapy  Participation Level:  Minimal  Participation Quality:  Attentive  Affect:  Flat  Cognitive:  Alert  Insight:  Limited  Engagement in Therapy:  Limited  Modes of Intervention:  Discussion, Education, Socialization and Support  Summary of Progress/Problems:[employer] (M[employer]) speaker came to talk about his personal journey with substance abuse and mental illness. Group members were challenged to process ways by which to relate to the speaker. MHA speaker provided handouts and educational information pertaining to groups and services offered by the [facility]. Jamie Huber attended group and stayed the entire time. She sat quietly and listened to the speaker.     Huber,Jamie 01/10/2015, 1:42 PM

## 2015-01-10 NOTE — [facility] Group Notes (Signed)
[facility] L[facility] Discharge Planning Group Note   01/10/2015 3:21 PM  Participation Quality:  Active   Mood/Affect:  Flat  Depression Rating:  3  Anxiety Rating:  5  Thoughts of Suicide:  No Will you contract for safety?   NA  Current AVH:  Yes  Plan for Discharge/Comments:  Jamie Huber demonstrates disorganized thinking. She reported having VH. She stated they were "more interesting than TV." She stated she wanted to "keep the peace" with her family. Pt plans to return home and follow up with outpatient.   Transportation Means: Family   Supports:Family   Hyatt,Candace

## 2015-01-10 NOTE — Progress Notes (Signed)
D:Pt rates her depression as a 4 and anxiety as a 5 on 1-10 scale with 10 being the most. Pt is working on controlling her mood swings and anger issues. She is attending groups and interacting with peers on the unit.  A:Offered support, encouragement and 15 minute checks. R:Pt denies si and hi. Safety maintained on the unit.

## 2015-01-10 NOTE — Progress Notes (Signed)
[facility] MD Progress Note  01/10/2015 2:10 PM Jamie Huber  MRN:  [id number] Subjective: Patient states " I feel the voices are still there ,but not as loud as yesterday.'  Objective:Patient seen and chart reviewed.Patient discussed with treatment team. Patient presented after being IVC ed for psychosis as well as HI towards family members. Patient wanted to get firearm to deal with these family members according of IVC petition. Patient seen this AM, has a cloth tied over her head, sat up in bed, initially appeared to be calm. But as she continued to talk , started talking about VCR recorders in her room and people stealing from her as well as voices in her head that tells her negative stuff. Patient appears to be psychotic , delusional as well as paranoid. Pt lacks insight in to her illness and lacks reasoning. Patient has been compliant on her medications. Denies side effects. Patient talked about her daughter being with her father and this has been a stressor for her since she has to find new things to do for self. Patient is isolative , withdrawn , appears to be internally preoccupied.   Principal Problem: Bipolar disorder R/O Schizoaffective disorder Bipolar type verus Bipolar disorder type I  Diagnosis:   Patient Active Problem List   Diagnosis Date Noted  . Bipolar disorder [F31.9] 01/10/2015  . Psychosis [F29] 01/09/2015  . ATRIAL SEPTAL DEFECT, HX OF [Z86.79] 04/18/2009   Total Time spent with patient: 30 minutes   Past Medical History:  Past Medical History  Diagnosis Date  . Atrial septal defect   . Manic depression   . Anxiety   . Hypertension     Past Surgical History  Procedure Laterality Date  . Asd repair     Family History: History reviewed. No pertinent family history. Social History:  History  Alcohol Use  . Yes     History  Drug Use No    History   Social History  . Marital Status: Single    Spouse Name: N/A  . Number of Children: N/A  . Years of  Education: N/A   Occupational History  . Student    Social History Main Topics  . Smoking status: Current Every Day Smoker    Types: Cigarettes  . Smokeless tobacco: Not on file  . Alcohol Use: Yes  . Drug Use: No  . Sexual Activity: Yes    Birth Control/ Protection: IUD   Other Topics Concern  . None   Social History Narrative   Pregnant   Additional History:    Sleep: Fair  Appetite:  Fair      Musculoskeletal: Strength & Muscle Tone: within normal limits Gait & Station: normal Patient leans: N/A   Psychiatric Specialty Exam: Physical Exam  Review of Systems  Psychiatric/Behavioral: Positive for depression and hallucinations. The patient is nervous/anxious.     Blood pressure 118/57, pulse 58, temperature 98 F (36.7 C), temperature source Oral, resp. rate 14, height 5' 6.25" (1.683 m), weight 72.576 kg (160 lb).Body mass index is 25.62 kg/(m^2).  General Appearance: Disheveled  Eye Sport and exercise psychologist::  Fair  Speech:  Normal Rate  Volume:  Increased  Mood:  Irritable  Affect:  Labile  Thought Process:  Disorganized  Orientation:  Full (Time, Place, and Person)  Thought Content:  Delusions, Hallucinations: Auditory, Paranoid Ideation and Rumination  Suicidal Thoughts:  No  Homicidal Thoughts:  No  Memory:  Immediate;   Fair Recent;   Poor Remote;   Poor  Judgement:  Impaired  Insight:  Lacking  Psychomotor Activity:  Restlessness  Concentration:  Poor  Recall:  [place] of Knowledge:Fair  Language: Fair  Akathisia:  No  Handed:  Right  AIMS (if indicated):     Assets:  Physical Health  ADL's:  Intact  Cognition: WNL  Sleep:  Number of Hours: 6.75     Current Medications: Current Facility-Administered Medications  Medication Dose Route Frequency Provider Last Rate Last Dose  . acetaminophen (TYLENOL) tablet 650 mg  650 mg Oral Q6H PRN Elmarie Shiley, NP   650 mg at 01/10/15 0836  . alum & mag hydroxide-simeth (MAALOX/MYLANTA) 200-200-20 MG/5ML  suspension 30 mL  30 mL Oral Q4H PRN Elmarie Shiley, NP      . benztropine (COGENTIN) tablet 0.5 mg  0.5 mg Oral BID Elmarie Shiley, NP   0.5 mg at 01/10/15 0834  . divalproex (DEPAKOTE) DR tablet 500 mg  500 mg Oral Q12H Kylie Simmonds, MD   500 mg at 01/10/15 1204  . feeding supplement (ENSURE COMPLETE) (ENSURE COMPLETE) liquid 237 mL  237 mL Oral BID BM Yareth Kearse, MD   237 mL at 01/10/15 1034  . haloperidol (HALDOL) tablet 10 mg  10 mg Oral QPM Ursula Alert, MD      . Derrill Memo ON 01/11/2015] haloperidol (HALDOL) tablet 5 mg  5 mg Oral Daily Jannifer Fischler, MD      . magnesium hydroxide (MILK OF MAGNESIA) suspension 30 mL  30 mL Oral Daily PRN Elmarie Shiley, NP      . OLANZapine zydis (ZYPREXA) disintegrating tablet 5 mg  5 mg Oral Q8H PRN Elmarie Shiley, NP      . traZODone (DESYREL) tablet 50 mg  50 mg Oral QHS PRN Elmarie Shiley, NP        Lab Results:  Results for orders placed or performed during the hospital encounter of 01/09/15 (from the past 48 hour(s))  TSH     Status: None   Collection Time: 01/10/15  6:44 AM  Result Value Ref Range   TSH 1.210 0.350 - 4.500 uIU/mL    Comment: Performed at [facility]  Lipid panel     Status: None   Collection Time: 01/10/15  6:44 AM  Result Value Ref Range   Cholesterol 119 0 - 200 mg/dL   Triglycerides 77 <150 mg/dL   HDL 41 >39 mg/dL   Total CHOL/HDL Ratio 2.9 RATIO   VLDL 15 0 - 40 mg/dL   LDL Cholesterol 63 0 - 99 mg/dL    Comment:        Total Cholesterol/HDL:CHD Risk Coronary Heart Disease Risk Table                     Men   Women  1/2 Average Risk   3.4   3.3  Average Risk       5.0   4.4  2 X Average Risk   9.6   7.1  3 X Average Risk  23.4   11.0        Use the calculated Patient Ratio above and the CHD Risk Table to determine the patient's CHD Risk.        ATP III CLASSIFICATION (LDL):  <100     mg/dL   Optimal  100-129  mg/dL   Near or Above                    Optimal  130-159  mg/dL  Borderline  160-189   mg/dL   High  >190     mg/dL   Very High Performed at [facility]   Comprehensive metabolic panel     Status: None   Collection Time: 01/10/15  6:44 AM  Result Value Ref Range   Sodium 138 135 - 145 mmol/L   Potassium 3.7 3.5 - 5.1 mmol/L   Chloride 103 96 - 112 mmol/L   CO2 28 19 - 32 mmol/L   Glucose, Bld 95 70 - 99 mg/dL   BUN 8 6 - 23 mg/dL   Creatinine, Ser 0.62 0.50 - 1.10 mg/dL   Calcium 9.4 8.4 - 10.5 mg/dL   Total Protein 7.6 6.0 - 8.3 g/dL   Albumin 4.4 3.5 - 5.2 g/dL   AST 12 0 - 37 U/L   ALT 14 0 - 35 U/L   Alkaline Phosphatase 49 39 - 117 U/L   Total Bilirubin 0.9 0.3 - 1.2 mg/dL   GFR calc non Af Amer >90 >90 mL/min   GFR calc Af Amer >90 >90 mL/min    Comment: (NOTE) The eGFR has been calculated using the CKD EPI equation. This calculation has not been validated in all clinical situations. eGFR's persistently <90 mL/min signify possible Chronic Kidney Disease.    Anion gap 7 5 - 15    Comment: Performed at [facility]    Physical Findings: AIMS: Facial and Oral Movements Muscles of Facial Expression: None, normal Lips and Perioral Area: None, normal Jaw: None, normal Tongue: None, normal,Extremity Movements Upper (arms, wrists, hands, fingers): None, normal Lower (legs, knees, ankles, toes): None, normal, Trunk Movements Neck, shoulders, hips: None, normal, Overall Severity Severity of abnormal movements (highest score from questions above): None, normal Incapacitation due to abnormal movements: None, normal Patient's awareness of abnormal movements (rate only patient's report): No Awareness, Dental Status Current problems with teeth and/or dentures?: No Does patient usually wear dentures?: No  CIWA:  CIWA-Ar Total: 0 COWS:  COWS Total Score: 3   Assessment:Patient is a 43 y old AAF with hx of psychosis , noncompliant on medications was IVCed for psychosis as well as having HI towards family members. Patient continues to be  psychotic requiring medication readjustment.    Treatment Plan Summary: Daily contact with patient to assess and evaluate symptoms and progress in treatment and Medication management Will increase Haldol to 15 mg po daily for psychosis as well as mood sx. Will add Depakote DR 500 mg po q12h for mood lability. Will continue Trazodone 50 mg po qhs prn for sleep. Will make available medications for agitation as well as anxiety. Will continue to monitor vitals ,medication compliance and treatment side effects while patient is here.  Will monitor for medical issues as well as call consult as needed.  Reviewed labs ,TSH ,CMP wnl .  CSW will start working on disposition.  Patient to participate in therapeutic milieu .    Medical Decision Making:  Review of Psycho-Social Stressors (1), Review or order clinical lab tests (1), Established Problem, Worsening (2), Review or order medicine tests (1), Review of Medication Regimen & Side Effects (2) and Review of New Medication or Change in Dosage (2)     Kleber Crean md 01/10/2015, 2:10 PM

## 2015-01-10 NOTE — Tx Team (Signed)
Pt has been in bed most of the evening.  When awakened, pt reported her day was "ok".  She denies SI/HI.  She says she hears voices sometimes, but was not presently hearing any voices.  Conversation was minimal with patient as she had been asleep and was not willing to engage in conversation.  She voiced no needs or concerns to Clinical research associatewriter.  She is unsure what she will do at discharge, but she wants to get her daughter back.  Pt was encouraged to make her needs known to staff.  Support and encouragement offered.  Safety maintained with q15 minute checks.

## 2015-01-10 NOTE — Plan of Care (Signed)
Problem: Ineffective individual coping Goal: STG: Patient will remain free from self harm Outcome: Progressing Pt denies self harm thoughts.     

## 2015-01-11 ENCOUNTER — Encounter (HOSPITAL_COMMUNITY): Payer: Self-pay | Admitting: *Deleted

## 2015-01-11 LAB — COMPREHENSIVE METABOLIC PANEL
ALK PHOS: 49 U/L (ref 39–117)
ALT: 15 U/L (ref 0–35)
AST: 17 U/L (ref 0–37)
Albumin: 4.3 g/dL (ref 3.5–5.2)
Anion gap: 8 (ref 5–15)
BUN: 8 mg/dL (ref 6–23)
CO2: 28 mmol/L (ref 19–32)
Calcium: 8.9 mg/dL (ref 8.4–10.5)
Chloride: 101 mmol/L (ref 96–112)
Creatinine, Ser: 0.74 mg/dL (ref 0.50–1.10)
GLUCOSE: 105 mg/dL — AB (ref 70–99)
Potassium: 3.5 mmol/L (ref 3.5–5.1)
SODIUM: 137 mmol/L (ref 135–145)
Total Bilirubin: 0.6 mg/dL (ref 0.3–1.2)
Total Protein: 7.7 g/dL (ref 6.0–8.3)

## 2015-01-11 LAB — GLUCOSE, CAPILLARY: GLUCOSE-CAPILLARY: 87 mg/dL (ref 70–99)

## 2015-01-11 LAB — HEMOGLOBIN A1C
Hgb A1c MFr Bld: 5.2 % (ref 4.8–5.6)
Mean Plasma Glucose: 103 mg/dL

## 2015-01-11 LAB — CBC WITH DIFFERENTIAL/PLATELET
BASOS PCT: 0 % (ref 0–1)
Basophils Absolute: 0 10*3/uL (ref 0.0–0.1)
Eosinophils Absolute: 0 10*3/uL (ref 0.0–0.7)
Eosinophils Relative: 0 % (ref 0–5)
HCT: 40 % (ref 36.0–46.0)
Hemoglobin: 13.1 g/dL (ref 12.0–15.0)
Lymphocytes Relative: 28 % (ref 12–46)
Lymphs Abs: 2.6 10*3/uL (ref 0.7–4.0)
MCH: 29.3 pg (ref 26.0–34.0)
MCHC: 32.8 g/dL (ref 30.0–36.0)
MCV: 89.5 fL (ref 78.0–100.0)
MONO ABS: 0.5 10*3/uL (ref 0.1–1.0)
Monocytes Relative: 5 % (ref 3–12)
NEUTROS PCT: 67 % (ref 43–77)
Neutro Abs: 6.3 10*3/uL (ref 1.7–7.7)
PLATELETS: 206 10*3/uL (ref 150–400)
RBC: 4.47 MIL/uL (ref 3.87–5.11)
RDW: 12.3 % (ref 11.5–15.5)
WBC: 9.4 10*3/uL (ref 4.0–10.5)

## 2015-01-11 LAB — POC URINE PREG, ED: Preg Test, Ur: NEGATIVE

## 2015-01-11 MED ORDER — SODIUM CHLORIDE 0.9 % IV BOLUS (SEPSIS)
2000.0000 mL | Freq: Once | INTRAVENOUS | Status: AC
  Administered 2015-01-11: 2000 mL via INTRAVENOUS

## 2015-01-11 NOTE — Progress Notes (Addendum)
At 10:55 AM patient was standing at the medication window on the [address], awaiting to get her Ensure when all of sudden, she blacks out and falls on the floor; reporting that she did hit the back of her head; patient did not verbalize pain or injury anywhere. Head to toe assessment completed and vital signs/CBG obtained; results yield WNL; P 60 R 16 BP 128/77 T 97.6 O2 100% RA; no s/s of injury or irregular breathing noted; patient continue to wear non-skid socks, re-educate patient and bed placed in lowest position. Writer informed Eappen, MD of the patient's status and orders were placed for the patient to be evaluated at the ED; Berneice Heinrichina Tate, RN/AC notified as well. Patient transferred to ED via ambulance and [facility] (with regards to involuntary admission status). Writer attempted to contact the patient's aunt and mother, but was unable to reach either relative; no voice mailbox available to leave message.

## 2015-01-11 NOTE — ED Provider Notes (Signed)
CSN: [id number]     Arrival date & time 01/11/15  1203 History   First MD Initiated Contact with Patient 01/11/15 1207     Chief Complaint  Patient presents with  . Loss of Consciousness     (Consider location/radiation/quality/duration/timing/severity/associated sxs/prior Treatment) HPI Comments: Patient here complaining of syncope just prior to arrival. Symptoms occur when she went to stand up. Does note decreased oral intake. Denies any palpitations or chest pain. No black or bloody stools. No heavy periods recently. According to staff at the behavior health hospital there was no reported seizure activity. There was no injury noted. She is back at her baseline. She is currently under psychiatric care for schizophrenia. Denies any recent toxic ingestions  Patient is a 29 y.o. female presenting with syncope. The history is provided by the patient.  Loss of Consciousness   Past Medical History  Diagnosis Date  . Atrial septal defect   . Manic depression   . Anxiety   . Hypertension    Past Surgical History  Procedure Laterality Date  . Asd repair     History reviewed. No pertinent family history. History  Substance Use Topics  . Smoking status: Current Every Day Smoker    Types: Cigarettes  . Smokeless tobacco: Not on file  . Alcohol Use: Yes   OB History    No data available     Review of Systems  Cardiovascular: Positive for syncope.  All other systems reviewed and are negative.     Allergies  Review of patient's allergies indicates no known allergies.  Home Medications   Prior to Admission medications   Not on File   BP 110/62 mmHg  Pulse 89  Temp(Src) 97.9 F (36.6 C) (Oral)  Resp 20  Ht 5' 6.25" (1.683 m)  Wt 160 lb (72.576 kg)  BMI 25.62 kg/m2  SpO2 100% Physical Exam  Constitutional: She is oriented to person, place, and time. She appears well-developed and well-nourished.  Non-toxic appearance. No distress.  HENT:  Head: Normocephalic and  atraumatic.  Eyes: Conjunctivae, EOM and lids are normal. Pupils are equal, round, and reactive to light.  Neck: Normal range of motion. Neck supple. No tracheal deviation present. No thyroid mass present.  Cardiovascular: Normal rate, regular rhythm and normal heart sounds.  Exam reveals no gallop.   No murmur heard. Pulmonary/Chest: Effort normal and breath sounds normal. No stridor. No respiratory distress. She has no decreased breath sounds. She has no wheezes. She has no rhonchi. She has no rales.  Abdominal: Soft. Normal appearance and bowel sounds are normal. She exhibits no distension. There is no tenderness. There is no rebound and no CVA tenderness.  Musculoskeletal: Normal range of motion. She exhibits no edema or tenderness.  Neurological: She is alert and oriented to person, place, and time. She has normal strength. No cranial nerve deficit or sensory deficit. GCS eye subscore is 4. GCS verbal subscore is 5. GCS motor subscore is 6.  Skin: Skin is warm and dry. No abrasion and no rash noted.  Psychiatric: She has a normal mood and affect. Her speech is normal and behavior is normal.  Nursing note and vitals reviewed.   ED Course  Procedures (including critical care time) Labs Review Labs Reviewed  TSH  LIPID PANEL  HEMOGLOBIN A1C  COMPREHENSIVE METABOLIC PANEL  GLUCOSE, CAPILLARY  CBC WITH DIFFERENTIAL/PLATELET  COMPREHENSIVE METABOLIC PANEL  I-STAT BETA HCG BLOOD, ED ([facility], [facility], [facility] ONLY)    Imaging Review No results found.  EKG Interpretation   Date/Time:  Thursday January 11 2015 12:14:10 EST Ventricular Rate:  88 PR Interval:  132 QRS Duration: 87 QT Interval:  377 QTC Calculation: 456 R Axis:   13 Text Interpretation:  Sinus rhythm Borderline Q waves in inferior leads  Nonspecific T abnormalities, anterior leads No significant change since  last tracing Confirmed by Kanna Dafoe  MD, Taegan Standage ([id number]) on 01/11/2015 12:29:32  PM      MDM   Final diagnoses:  None      Patient given 2 L of IV fluids and feels better. Suspect that she had a vasovagal episode due to dehydration. Do not think that this represents ACS or PE. Patient stable to be sent back to behavior health   Toy Baker, MD 01/11/15 [phone]

## 2015-01-11 NOTE — ED Notes (Addendum)
[facility]. Pt fell back from standing position, hitting the back of her head on tile floor. Pt denies neck/back pain, no injury noted. Pt walked to stretcher. Staff report pt is at baseline, is A&O. NSR on monitor. CBG 86.   Pt is IVC, papers state pt has auditory hallucinations, aggression and HI.

## 2015-01-11 NOTE — Progress Notes (Signed)
D: Pt denies SI/HI/AVH. Pt is pleasant and cooperative. Pt stated she stopped her meds because she was feeling better, pt appears to understand the importance of continuing her medication regimen. Pt observed up on unit in no distress.    A: Pt was offered support and encouragement. Pt was given scheduled medications. Pt was encourage to attend groups. Q 15 minute checks were done for safety.    R:Pt attends groups and interacts well with peers and staff. Pt is taking medication. Pt has no complaints at this time .Pt receptive to treatment and safety maintained on unit.

## 2015-01-11 NOTE — Tx Team (Signed)
  Interdisciplinary Treatment Plan Update   Date Reviewed:  01/11/2015  Time Reviewed:  10:47 AM  Progress in Treatment:   Attending groups: Yes Participating in groups: Yes Taking medication as prescribed: Yes  Tolerating medication: Yes Family/Significant other contact made: Yes  Patient understands diagnosis: Yes  Discussing patient identified problems/goals with staff: Yes  See initial care plan Medical problems stabilized or resolved: Yes Denies suicidal/homicidal ideation: Yes  In tx team Patient has not harmed self or others: Yes  For review of initial/current patient goals, please see plan of care.  Estimated Length of Stay:  3-5 days  Reason for Continuation of Hospitalization: Hallucinations Medication stabilization Other; describe Paranoia, disorganization  New Problems/Goals identified:  N/A  Discharge Plan or Barriers:   return home, follow up outpt  Additional Comments:  " I feel the voices are still there ,but not as loud as yesterday.'  Patient presented after being IVC ed for psychosis as well as HI towards family members. Patient wanted to get firearm to deal with these family members according of IVC petition. Patient seen this AM, has a cloth tied over her head, sat up in bed, initially appeared to be calm. But as she continued to talk , started talking about VCR recorders in her room and people stealing from her as well as voices in her head that tells her negative stuff. Patient appears to be psychotic , delusional as well as paranoid. Pt lacks insight in to her illness and lacks reasoning.  Depakote, Haldol trial  Attendees:  Signature: Ivin BootySarama Eappen, MD 01/11/2015 10:47 AM   Signature: Richelle Itood Kamilo Och, LCSW 01/11/2015 10:47 AM  Signature:  01/11/2015 10:47 AM  Signature: Harold Barbanonecia Byrd, RN 01/11/2015 10:47 AM  Signature:  01/11/2015 10:47 AM  Signature:  01/11/2015 10:47 AM  Signature:   01/11/2015 10:47 AM  Signature:    Signature:    Signature:    Signature:     Signature:    Signature:      Scribe for Treatment Team:   Richelle Itood Temesha Queener, LCSW  01/11/2015 10:47 AM

## 2015-01-11 NOTE — [facility] Group Notes (Signed)
[facility] Group Notes:  (Counselor/Nursing/MHT/Case Management/Adjunct)  01/11/2015 1:15PM  Type of Therapy:  Group Therapy  Participation Level:  Pt was in the ED due to a fall    Summary of Progress/Problems: The topic for group was balance in life.  Pt participated in the discussion about when their life was in balance and out of balance and how this feels.  Pt discussed ways to get back in balance and short term goals they can work on to get where they want to be.    Daryel Geraldorth, Mariam Helbert B 01/11/2015 11:27 AM

## 2015-01-11 NOTE — Progress Notes (Signed)
[facility] MD Progress Note  01/11/2015 11:12 AM Jamie Huber  MRN:  [id number] Subjective: Patient states " I still hear voices , they are the same voices, keep saying the same things .'  Objective:Patient seen and chart reviewed.Patient discussed with treatment team. Patient presented after being IVC ed for psychosis as well as HI towards family members. Patient wanted to get firearm to deal with these family members according of IVC petition. Patient seen this AM, was in bed . Patient continues to be withdrawn , delusional and paranoid. Patient also endorses AH ,which are improving since admission. Patient with mood lability on and off. But no disruptive issues noted on the unit per staff. Pt lacks insight in to her illness and lacks reasoning. Patient has been compliant on her medications. Denies side effects.   Later on this AM Patient was talking to staff , when she blacked out , felt like had blurry vision and passed out , hit her head on the floor. Patient came out of it in a few seconds , and was alert when the nursing staff spoke to her. She did not have any eye rolling up , or urinary /bowel incontinence or tongue biting . Patient's VS was wnl. Patient was seen by writer after this happened and she was alert, oriented ,denied any complaints. Reported she did not have an episode like this for a very long time. She could not give more details about this being a limited historian.   Principal Problem: Bipolar disorder R/O Schizoaffective disorder Bipolar type verus Bipolar disorder type I  Diagnosis:   Patient Active Problem List   Diagnosis Date Noted  . Bipolar disorder [F31.9] 01/10/2015  . Psychosis [F29] 01/09/2015  . ATRIAL SEPTAL DEFECT, HX OF [Z86.79] 04/18/2009   Total Time spent with patient: 30 minutes   Past Medical History:  Past Medical History  Diagnosis Date  . Atrial septal defect   . Manic depression   . Anxiety   . Hypertension     Past Surgical History   Procedure Laterality Date  . Asd repair     Family History: History reviewed. No pertinent family history. Social History:  History  Alcohol Use  . Yes     History  Drug Use No    History   Social History  . Marital Status: Single    Spouse Name: N/A  . Number of Children: N/A  . Years of Education: N/A   Occupational History  . Student    Social History Main Topics  . Smoking status: Current Every Day Smoker    Types: Cigarettes  . Smokeless tobacco: Not on file  . Alcohol Use: Yes  . Drug Use: No  . Sexual Activity: Yes    Birth Control/ Protection: IUD   Other Topics Concern  . None   Social History Narrative   Pregnant   Additional History:    Sleep: Fair  Appetite:  Fair      Musculoskeletal: Strength & Muscle Tone: within normal limits Gait & Station: normal Patient leans: N/A   Psychiatric Specialty Exam: Physical Exam  Review of Systems  Constitutional: Positive for malaise/fatigue.  Psychiatric/Behavioral: Positive for hallucinations. The patient is nervous/anxious.     Blood pressure 128/77, pulse 60, temperature 97.6 F (36.4 C), temperature source Oral, resp. rate 16, height 5' 6.25" (1.683 m), weight 72.576 kg (160 lb).Body mass index is 25.62 kg/(m^2).  General Appearance: Disheveled  Eye Contact::  Fair  Speech:  Normal Rate  Volume:  Normal  Mood:  Irritable peridocially  Affect:  Labile  Thought Process:  Disorganized  Orientation:  Full (Time, Place, and Person)  Thought Content:  Delusions, Hallucinations: Auditory, Paranoid Ideation and Rumination  Suicidal Thoughts:  No  Homicidal Thoughts:  No  Memory:  Immediate;   Fair Recent;   Poor Remote;   Poor  Judgement:  Impaired  Insight:  Lacking  Psychomotor Activity:  Restlessness  Concentration:  Poor  Recall:  Nitro of Knowledge:Fair  Language: Fair  Akathisia:  No  Handed:  Right  AIMS (if indicated):     Assets:  Physical Health  ADL's:  Intact   Cognition: WNL  Sleep:  Number of Hours: 6.75     Current Medications: Current Facility-Administered Medications  Medication Dose Route Frequency Provider Last Rate Last Dose  . acetaminophen (TYLENOL) tablet 650 mg  650 mg Oral Q6H PRN Elmarie Shiley, NP   650 mg at 01/10/15 0836  . alum & mag hydroxide-simeth (MAALOX/MYLANTA) 200-200-20 MG/5ML suspension 30 mL  30 mL Oral Q4H PRN Elmarie Shiley, NP      . benztropine (COGENTIN) tablet 0.5 mg  0.5 mg Oral BID Elmarie Shiley, NP   0.5 mg at 01/11/15 6203  . divalproex (DEPAKOTE) DR tablet 500 mg  500 mg Oral Q12H Ursula Alert, MD   500 mg at 01/11/15 0821  . feeding supplement (ENSURE COMPLETE) (ENSURE COMPLETE) liquid 237 mL  237 mL Oral BID BM Staton Markey, MD   237 mL at 01/11/15 1057  . haloperidol (HALDOL) tablet 10 mg  10 mg Oral QPM Corde Antonini, MD   10 mg at 01/10/15 1740  . haloperidol (HALDOL) tablet 5 mg  5 mg Oral Daily Ursula Alert, MD   5 mg at 01/11/15 5597  . magnesium hydroxide (MILK OF MAGNESIA) suspension 30 mL  30 mL Oral Daily PRN Elmarie Shiley, NP      . OLANZapine zydis (ZYPREXA) disintegrating tablet 5 mg  5 mg Oral Q8H PRN Elmarie Shiley, NP      . traZODone (DESYREL) tablet 50 mg  50 mg Oral QHS PRN Elmarie Shiley, NP        Lab Results:  Results for orders placed or performed during the hospital encounter of 01/09/15 (from the past 48 hour(s))  TSH     Status: None   Collection Time: 01/10/15  6:44 AM  Result Value Ref Range   TSH 1.210 0.350 - 4.500 uIU/mL    Comment: Performed at [facility]  Lipid panel     Status: None   Collection Time: 01/10/15  6:44 AM  Result Value Ref Range   Cholesterol 119 0 - 200 mg/dL   Triglycerides 77 <150 mg/dL   HDL 41 >39 mg/dL   Total CHOL/HDL Ratio 2.9 RATIO   VLDL 15 0 - 40 mg/dL   LDL Cholesterol 63 0 - 99 mg/dL    Comment:        Total Cholesterol/HDL:CHD Risk Coronary Heart Disease Risk Table                     Men   Women  1/2 Average Risk   3.4    3.3  Average Risk       5.0   4.4  2 X Average Risk   9.6   7.1  3 X Average Risk  23.4   11.0        Use the calculated Patient Ratio above and  the CHD Risk Table to determine the patient's CHD Risk.        ATP III CLASSIFICATION (LDL):  <100     mg/dL   Optimal  100-129  mg/dL   Near or Above                    Optimal  130-159  mg/dL   Borderline  160-189  mg/dL   High  >190     mg/dL   Very High Performed at [facility]   Hemoglobin A1c     Status: None   Collection Time: 01/10/15  6:44 AM  Result Value Ref Range   Hgb A1c MFr Bld 5.2 4.8 - 5.6 %    Comment: (NOTE)         Pre-diabetes: 5.7 - 6.4         Diabetes: >6.4         Glycemic control for adults with diabetes: <7.0    Mean Plasma Glucose 103 mg/dL    Comment: (NOTE) Performed At: [facility] [place], [place] [id number] Lindon Romp MD [id number] Performed at [facility]   Comprehensive metabolic panel     Status: None   Collection Time: 01/10/15  6:44 AM  Result Value Ref Range   Sodium 138 135 - 145 mmol/L   Potassium 3.7 3.5 - 5.1 mmol/L   Chloride 103 96 - 112 mmol/L   CO2 28 19 - 32 mmol/L   Glucose, Bld 95 70 - 99 mg/dL   BUN 8 6 - 23 mg/dL   Creatinine, Ser 0.62 0.50 - 1.10 mg/dL   Calcium 9.4 8.4 - 10.5 mg/dL   Total Protein 7.6 6.0 - 8.3 g/dL   Albumin 4.4 3.5 - 5.2 g/dL   AST 12 0 - 37 U/L   ALT 14 0 - 35 U/L   Alkaline Phosphatase 49 39 - 117 U/L   Total Bilirubin 0.9 0.3 - 1.2 mg/dL   GFR calc non Af Amer >90 >90 mL/min   GFR calc Af Amer >90 >90 mL/min    Comment: (NOTE) The eGFR has been calculated using the CKD EPI equation. This calculation has not been validated in all clinical situations. eGFR's persistently <90 mL/min signify possible Chronic Kidney Disease.    Anion gap 7 5 - 15    Comment: Performed at [facility]    Physical Findings: AIMS: Facial and Oral Movements Muscles of Facial  Expression: None, normal Lips and Perioral Area: None, normal Jaw: None, normal Tongue: None, normal,Extremity Movements Upper (arms, wrists, hands, fingers): None, normal Lower (legs, knees, ankles, toes): None, normal, Trunk Movements Neck, shoulders, hips: None, normal, Overall Severity Severity of abnormal movements (highest score from questions above): None, normal Incapacitation due to abnormal movements: None, normal Patient's awareness of abnormal movements (rate only patient's report): No Awareness, Dental Status Current problems with teeth and/or dentures?: No Does patient usually wear dentures?: No  CIWA:  CIWA-Ar Total: 0 COWS:  COWS Total Score: 3   Assessment:Patient is a 69 y old AAF with hx of psychosis , noncompliant on medications was IVCed for psychosis as well as having HI towards family members. Patient continues to be delusional and paranoid . Patient also had an incident this AM ,when she had an episode when she blacked out. Patient to be sent to ED for evaluation (according to unit protocol) psychotic requiring medication readjustment.    Treatment Plan Summary: Daily contact  with patient to assess and evaluate symptoms and progress in treatment and Medication management Will continue Haldol  15 mg po daily for psychosis as well as mood sx. Will continue Depakote DR 500 mg po q12h for mood lability. Will continue Trazodone 50 mg po qhs prn for sleep. Will make available medications for agitation as well as anxiety. Will continue to monitor vitals ,medication compliance and treatment side effects while patient is here. Patient to be send to ED for evaluation of her ? Syncopal episode. Will monitor for medical issues as well as call consult as needed.  Reviewed labs ,TSH ,CMP wnl .  CSW will start working on disposition.  Patient to participate in therapeutic milieu .    Medical Decision Making:  Review of Psycho-Social Stressors (1), Review or order  clinical lab tests (1), Established Problem, Worsening (2), Review or order medicine tests (1), Review of Medication Regimen & Side Effects (2) and Review of New Medication or Change in Dosage (2)     Helane Briceno md 01/11/2015, 11:12 AM

## 2015-01-11 NOTE — ED Notes (Signed)
Bed: WA19 Expected date:  Expected time:  Means of arrival:  Comments: Ems syncope 

## 2015-01-11 NOTE — Plan of Care (Signed)
Problem: Alteration in thought process Goal: LTG-Patient verbalizes understanding importance med regimen (Patient verbalizes understanding of importance of medication regimen and need to continue outpatient care.)  Outcome: Progressing Pt stated she will continue to take her medication in the future/
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
## 2015-01-12 MED ORDER — HALOPERIDOL 5 MG PO TABS
10.0000 mg | ORAL_TABLET | Freq: Every day | ORAL | Status: DC
  Administered 2015-01-13 – 2015-01-15 (×3): 10 mg via ORAL
  Filled 2015-01-12 (×4): qty 2

## 2015-01-12 NOTE — Progress Notes (Signed)
NUTRITION ASSESSMENT  Pt identified as at risk on the Malnutrition Screen Tool  INTERVENTION: 1. Educated patient on the importance of nutrition and encouraged intake of food and beverages. 2. Discussed weight goals. 3. Supplements: Ensure Complete po BID, each supplement provides 350 kcal and 13 grams of protein  NUTRITION DIAGNOSIS: Unintentional weight loss related to sub-optimal intake as evidenced by pt report.   Goal: Pt to meet >/= 90% of their estimated nutrition needs.  Monitor:  PO intake  Assessment:  Pt admitted with psychosis and bipolar disorder.  Pt reports fluctuating appetite, depending on the season of the year. Pt states she tends to eat less in the fall/winter months and eat more during spring/summer. Pt has been craving liquids more than solid foods lately.  Pt states her UBW is 180-200 lb. Pt with 25 lb weight loss since September (14% weight loss x 6 months, significant for time frame).  Pt has been ordered Ensure supplements. Pt is drinking them.   Height: Ht Readings from Last 1 Encounters:  01/09/15 5' 6.25" (1.683 m)    Weight: Wt Readings from Last 1 Encounters:  01/09/15 160 lb (72.576 kg)    Weight Hx: Wt Readings from Last 10 Encounters:  01/09/15 160 lb (72.576 kg)  08/01/14 185 lb (83.915 kg)  03/28/09 196 lb (88.905 kg)    BMI:  Body mass index is 25.62 kg/(m^2). Pt meets criteria for overweight based on current BMI.  Estimated Nutritional Needs: Kcal: 25-30 kcal/kg Protein: > 1 gram protein/kg Fluid: 1 ml/kcal  Diet Order: Diet regular Pt is also offered choice of unit snacks mid-morning and mid-afternoon.  Pt is eating as desired.   Lab results and medications reviewed.   Tilda FrancoLindsey Janijah Symons, MS, RD, LDN Pager: [phone] After Hours Pager: [phone]

## 2015-01-12 NOTE — Progress Notes (Signed)
D: Pt has depressed affect and mood.  She has remained in her room for the majority of the shift, minimally interacting with others on the unit.  She attended evening group.  Pt denies SI/HI, denies hallucinations, denies pain.  She reports her goal today was to "try to stay more alert today, get back in the routine."  Pt reports "I feel more rested than before."   A: Medication administered per order.  Met with pt 1:1 and provided support and encouragement.  PO fluids encouraged and pt provided with pitcher of water.  Safety maintained.   R: Pt is compliant with medications.  Pt verbally contracts for safety.  She reports she will notify staff of needs and concerns.  Will continue to monitor and assess for safety.

## 2015-01-12 NOTE — Progress Notes (Signed)
[facility] MD Progress Note  01/12/2015 11:53 AM Jamie Huber  MRN:  [id number] Subjective: Patient states " I still hear voices , they are like a video game or a basket ball game."  Objective:Patient seen and chart reviewed.Patient discussed with treatment team. Patient presented after being IVC ed for psychosis as well as HI towards family members. Patient wanted to get firearm to deal with these family members according of IVC petition. Patient seen this AM, was in bed . Patient continues to be a limited historian. Patient talks in monosyllables and needs to asked specific questions . Patient reports that he "black out " episode yesterday no longer bothers her. She denies SI/HI, but continues to have AH , describes it as a basketball game in the background.  Per staff no disruptive issues noted on the unit . Pt lacks insight in to her illness and lacks reasoning. Patient has been compliant on her medications. Denies side effects.Will get Depakote level in 2 days. CSW will work on disposition. Patient lived with parents and was aggressive at home. Patient states her mother does not want her to return home.    Principal Problem: Bipolar disorder R/O Schizoaffective disorder Bipolar type verus Bipolar disorder type I  Diagnosis:   Patient Active Problem List   Diagnosis Date Noted  . Bipolar disorder [F31.9] 01/10/2015  . Psychosis [F29] 01/09/2015  . ATRIAL SEPTAL DEFECT, HX OF [Z86.79] 04/18/2009   Total Time spent with patient: 30 minutes   Past Medical History:  Past Medical History  Diagnosis Date  . Atrial septal defect   . Manic depression   . Anxiety   . Hypertension     Past Surgical History  Procedure Laterality Date  . Asd repair     Family History: History reviewed. No pertinent family history. Social History:  History  Alcohol Use  . Yes     History  Drug Use No    History   Social History  . Marital Status: Single    Spouse Name: N/A  . Number of Children:  N/A  . Years of Education: N/A   Occupational History  . Student    Social History Main Topics  . Smoking status: Current Every Day Smoker    Types: Cigarettes  . Smokeless tobacco: Not on file  . Alcohol Use: Yes  . Drug Use: No  . Sexual Activity: Yes    Birth Control/ Protection: IUD   Other Topics Concern  . None   Social History Narrative   Pregnant   Additional History:    Sleep: Fair  Appetite:  Fair      Musculoskeletal: Strength & Muscle Tone: within normal limits Gait & Station: normal Patient leans: N/A   Psychiatric Specialty Exam: Physical Exam  ROS  Blood pressure 106/61, pulse 97, temperature 97.9 F (36.6 C), temperature source Oral, resp. rate 18, height 5' 6.25" (1.683 m), weight 72.576 kg (160 lb), SpO2 99 %.Body mass index is 25.62 kg/(m^2).  General Appearance: Disheveled  Eye Sport and exercise psychologist::  Fair  Speech:  Normal Rate  Volume:  Normal  Mood:  Irritable peridocially  Affect:  Labile  Thought Process:  Disorganized  Orientation:  Full (Time, Place, and Person)  Thought Content:  Delusions, Hallucinations: Auditory, Paranoid Ideation and Rumination  Suicidal Thoughts:  No  Homicidal Thoughts:  No  Memory:  Immediate;   Fair Recent;   Fair Remote;   Fair  Judgement:  Impaired  Insight:  Lacking  Psychomotor Activity:  Restlessness  Concentration:  Poor  Recall:  [employer] of Knowledge:Fair  Language: Fair  Akathisia:  No  Handed:  Right  AIMS (if indicated):     Assets:  Physical Health  ADL's:  Intact  Cognition: WNL  Sleep:  Number of Hours: 6.75     Current Medications: Current Facility-Administered Medications  Medication Dose Route Frequency Provider Last Rate Last Dose  . acetaminophen (TYLENOL) tablet 650 mg  650 mg Oral Q6H PRN Elmarie Shiley, NP   650 mg at 01/10/15 0836  . alum & mag hydroxide-simeth (MAALOX/MYLANTA) 200-200-20 MG/5ML suspension 30 mL  30 mL Oral Q4H PRN Elmarie Shiley, NP      . benztropine (COGENTIN)  tablet 0.5 mg  0.5 mg Oral BID Elmarie Shiley, NP   0.5 mg at 01/11/15 1704  . divalproex (DEPAKOTE) DR tablet 500 mg  500 mg Oral Q12H Joydan Gretzinger, MD   500 mg at 01/11/15 2230  . feeding supplement (ENSURE COMPLETE) (ENSURE COMPLETE) liquid 237 mL  237 mL Oral BID BM Tieasha Larsen, MD   237 mL at 01/11/15 1420  . haloperidol (HALDOL) tablet 10 mg  10 mg Oral QPM Ursula Alert, MD   10 mg at 01/11/15 1854  . [START ON 01/13/2015] haloperidol (HALDOL) tablet 10 mg  10 mg Oral Daily Sherlock Nancarrow, MD      . magnesium hydroxide (MILK OF MAGNESIA) suspension 30 mL  30 mL Oral Daily PRN Elmarie Shiley, NP      . OLANZapine zydis (ZYPREXA) disintegrating tablet 5 mg  5 mg Oral Q8H PRN Elmarie Shiley, NP      . traZODone (DESYREL) tablet 50 mg  50 mg Oral QHS PRN Elmarie Shiley, NP   50 mg at 01/11/15 2230    Lab Results:  Results for orders placed or performed during the hospital encounter of 01/09/15 (from the past 48 hour(s))  Glucose, capillary     Status: None   Collection Time: 01/11/15 11:12 AM  Result Value Ref Range   Glucose-Capillary 87 70 - 99 mg/dL  CBC with Differential/Platelet     Status: None   Collection Time: 01/11/15 12:22 PM  Result Value Ref Range   WBC 9.4 4.0 - 10.5 K/uL   RBC 4.47 3.87 - 5.11 MIL/uL   Hemoglobin 13.1 12.0 - 15.0 g/dL   HCT 40.0 36.0 - 46.0 %   MCV 89.5 78.0 - 100.0 fL   MCH 29.3 26.0 - 34.0 pg   MCHC 32.8 30.0 - 36.0 g/dL   RDW 12.3 11.5 - 15.5 %   Platelets 206 150 - 400 K/uL   Neutrophils Relative % 67 43 - 77 %   Neutro Abs 6.3 1.7 - 7.7 K/uL   Lymphocytes Relative 28 12 - 46 %   Lymphs Abs 2.6 0.7 - 4.0 K/uL   Monocytes Relative 5 3 - 12 %   Monocytes Absolute 0.5 0.1 - 1.0 K/uL   Eosinophils Relative 0 0 - 5 %   Eosinophils Absolute 0.0 0.0 - 0.7 K/uL   Basophils Relative 0 0 - 1 %   Basophils Absolute 0.0 0.0 - 0.1 K/uL  Comprehensive metabolic panel     Status: Abnormal   Collection Time: 01/11/15 12:22 PM  Result Value Ref Range   Sodium  137 135 - 145 mmol/L   Potassium 3.5 3.5 - 5.1 mmol/L   Chloride 101 96 - 112 mmol/L   CO2 28 19 - 32 mmol/L   Glucose, Bld 105 (H) 70 -  99 mg/dL   BUN 8 6 - 23 mg/dL   Creatinine, Ser 0.74 0.50 - 1.10 mg/dL   Calcium 8.9 8.4 - 10.5 mg/dL   Total Protein 7.7 6.0 - 8.3 g/dL   Albumin 4.3 3.5 - 5.2 g/dL   AST 17 0 - 37 U/L   ALT 15 0 - 35 U/L   Alkaline Phosphatase 49 39 - 117 U/L   Total Bilirubin 0.6 0.3 - 1.2 mg/dL   GFR calc non Af Amer >90 >90 mL/min   GFR calc Af Amer >90 >90 mL/min    Comment: (NOTE) The eGFR has been calculated using the CKD EPI equation. This calculation has not been validated in all clinical situations. eGFR's persistently <90 mL/min signify possible Chronic Kidney Disease.    Anion gap 8 5 - 15  POC Urine Pregnancy, ED (do NOT order at [facility])     Status: None   Collection Time: 01/11/15 12:50 PM  Result Value Ref Range   Preg Test, Ur NEGATIVE NEGATIVE    Comment:        THE SENSITIVITY OF THIS METHODOLOGY IS >24 mIU/mL     Physical Findings: AIMS: Facial and Oral Movements Muscles of Facial Expression: None, normal Lips and Perioral Area: None, normal Jaw: None, normal Tongue: None, normal,Extremity Movements Upper (arms, wrists, hands, fingers): None, normal Lower (legs, knees, ankles, toes): None, normal, Trunk Movements Neck, shoulders, hips: None, normal, Overall Severity Severity of abnormal movements (highest score from questions above): None, normal Incapacitation due to abnormal movements: None, normal Patient's awareness of abnormal movements (rate only patient's report): No Awareness, Dental Status Current problems with teeth and/or dentures?: No Does patient usually wear dentures?: No  CIWA:  CIWA-Ar Total: 0 COWS:  COWS Total Score: 3   Assessment:Patient is a 14 y old AAF with hx of psychosis , noncompliant on medications was IVCed for psychosis as well as having HI towards family members. Patient continues to endorse AH and  needs medication readjustment.    Treatment Plan Summary: Daily contact with patient to assess and evaluate symptoms and progress in treatment and Medication management Will increase Haldol to 20 mg po daily for psychosis as well as mood sx.Plan to DC on LAI. Will continue Depakote DR 500 mg po q12h for mood lability.Depakote level in 2 days - 01/14/15 Will continue Trazodone 50 mg po qhs prn for sleep. Will make available medications for agitation as well as anxiety. Will continue to monitor vitals ,medication compliance and treatment side effects while patient is here.  Will monitor for medical issues as well as call consult as needed.  Reviewed labs. CSW will start working on disposition.  Patient to participate in therapeutic milieu .    Medical Decision Making:  Review of Psycho-Social Stressors (1), Review or order clinical lab tests (1), Established Problem, Worsening (2), Review or order medicine tests (1), Review of Medication Regimen & Side Effects (2) and Review of New Medication or Change in Dosage (2)     Sahid Borba md 01/12/2015, 11:53 AM

## 2015-01-12 NOTE — Progress Notes (Signed)
D) Pt has remained quiet throughout the day. Has slept some. Denies SI and HI, delusions and hallucinations. Stated this evening that she doesn't like to go and eat all three meals "it's not what I do at home. I just eat one big meal". Pt encouraged to go to all the meals and to eat something and to drink fluids. Was able to attend some of the groups A) Given support, reassurance and praise. Educated about the importance of oral intake. R) Pt denies SI and HI, delusions and hallucinations.

## 2015-01-12 NOTE — [facility] Group Notes (Signed)
[facility] LCSW Group Therapy  01/12/2015 1:26 PM  Type of Therapy:  Group Therapy  Participation Level:  Minimal  Participation Quality:  Attentive  Affect:  Flat  Cognitive:  Alert  Insight:  Limited  Engagement in Therapy:  Limited  Modes of Intervention:  Discussion, Socialization and Support  Summary of Progress/Problems: Feelings around Relapse. Group members discussed the meaning of relapse and shared personal stories of relapse, how it affected them and others, and how they perceived themselves during this time. Group members were encouraged to identify triggers, warning signs and coping skills used when facing the possibility of relapse. Social supports were discussed and explored in detail. "Hope is knowing things are going to get better because they have been before. If you keep thinking everything is going to be bad, that's how its supposed to be." Jamie Huber chose a picture of suitcases because she is hopeful that one day she will be able to travel. Also, she chose a picture of a flower because she loves flowers. She enjoys planting flowers. Pt is much clearer today and taking responsibility for actions.   Huber,Jamie 01/12/2015, 1:26 PM

## 2015-01-12 NOTE — Plan of Care (Signed)
Problem: Ineffective individual coping Goal: STG: Patient will remain free from self harm Outcome: Progressing Pt has not harmed herself this evening.  Pt denies SI/thoughts of self-harm.  She verbally contracted for safety.

## 2015-01-13 NOTE — [facility] Group Notes (Signed)
[facility] Group Notes:  (Clinical Social Work)  01/13/2015  11:15-12:00PM  Summary of Progress/Problems:   The main focus of today's process group was to discuss patients' feelings about hospitalization, the stigma attached to mental health, and sources of motivation to stay well.  We then worked to identify a specific plan to avoid future hospitalizations when discharged from the hospital for this admission.  The patient expressed that she does not feel she can trust "just anyone" to diagnose her and provide her medications, but rather feels it needs to be a qualified person who has studied to do so.  She also said she needs to work on what triggers her, which at this time tends to be her mother.  CSW did not understand her explanation of this, but another patient offered support of having being through something similar.  CSW encouraged them to talk privately and share ideas, to which pt agreed.  Type of Therapy:  Group Therapy - Process  Participation Level:  Active  Participation Quality:  Attentive and Sharing  Affect:  Depressed and Flat  Cognitive:  Appropriate  Insight:  Developing/Improving  Engagement in Therapy:  Developing/Improving  Modes of Intervention:  Exploration, Discussion  Ambrose MantleMareida Grossman-Orr, LCSW 01/13/2015, 12:51 PM

## 2015-01-13 NOTE — Progress Notes (Signed)
Did not attend group 

## 2015-01-13 NOTE — Progress Notes (Signed)
D) Pt reports that she is rather shy and tends to be withdrawn. Did attend the groups today and participated admitting that she needs help to be involved and participate in the groups. Pt denies SI and HI delusions and hallucinations.  A) Given support, reassurance and praise. Encouragement given. Medication education given. R) Pt denies SI and HI.

## 2015-01-13 NOTE — Plan of Care (Signed)
Problem: Alteration in mood; excessive anxiety as evidenced by: Goal: STG-Pt will report an absence of self-harm thoughts/actions (Patient will report an absence of self-harm thoughts or actions)  Outcome: Progressing Pt has not harmed herself this evening.  She denies thoughts of self-harm/SI and verbally contracts for safety.

## 2015-01-13 NOTE — Progress Notes (Signed)
[facility] MD Progress Note  01/13/2015 1:03 PM Jamie Huber  MRN:  [id number] Subjective: Patient states "I have been feeling better every day". Pt feels more rested today. Mood is better. Sleep/appetite improving. Mild drowsiness due to meds, but no other side effects reported.    Objective:Patient seen and chart reviewed.Patient discussed with treatment team. Patient presented after being IVC ed for psychosis as well as HI towards family members. Patient wanted to get firearm to deal with these family members according of IVC petition.  She denies SI/HI/AVH.  Per staff no disruptive issues noted on the unit .  Patient has been compliant on her medications. Will get Depakote level in 1 days. CSW will work on disposition. Patient lived with parents and was aggressive at home. Patient states her mother does not want her to return home.    Principal Problem: Bipolar disorder R/O Schizoaffective disorder Bipolar type verus Bipolar disorder type I  Diagnosis:   Patient Active Problem List   Diagnosis Date Noted  . Bipolar disorder [F31.9] 01/10/2015  . Psychosis [F29] 01/09/2015  . ATRIAL SEPTAL DEFECT, HX OF [Z86.79] 04/18/2009   Total Time spent with patient: 30 minutes   Past Medical History:  Past Medical History  Diagnosis Date  . Atrial septal defect   . Manic depression   . Anxiety   . Hypertension     Past Surgical History  Procedure Laterality Date  . Asd repair     Family History: History reviewed. No pertinent family history. Social History:  History  Alcohol Use  . Yes     History  Drug Use No    History   Social History  . Marital Status: Single    Spouse Name: N/A  . Number of Children: N/A  . Years of Education: N/A   Occupational History  . Student    Social History Main Topics  . Smoking status: Current Every Day Smoker    Types: Cigarettes  . Smokeless tobacco: Not on file  . Alcohol Use: Yes  . Drug Use: No  . Sexual Activity: Yes    Birth  Control/ Protection: IUD   Other Topics Concern  . None   Social History Narrative   Pregnant   Additional History:    Sleep: Fair  Appetite:  Fair      Musculoskeletal: Strength & Muscle Tone: within normal limits Gait & Station: normal Patient leans: N/A   Psychiatric Specialty Exam: Physical Exam  ROS  Blood pressure 105/71, pulse 108, temperature 97.6 F (36.4 C), temperature source Oral, resp. rate 20, height 5' 6.25" (1.683 m), weight 72.576 kg (160 lb), SpO2 99 %.Body mass index is 25.62 kg/(m^2).  General Appearance: Disheveled  Eye Solicitor::  Fair  Speech:  Normal Rate  Volume:  Normal  Mood:  Irritable peridocially  Affect:  Labile  Thought Process:  Disorganized  Orientation:  Full (Time, Place, and Person)  Thought Content:  Paranoid Ideation. She denies AVH.  Suicidal Thoughts:  No  Homicidal Thoughts:  No  Memory:  Immediate;   Fair Recent;   Fair Remote;   Fair  Judgement:  Impaired  Insight:  Lacking  Psychomotor Activity:  Restlessness  Concentration:  Poor  Recall:  [employer] of Knowledge:Fair  Language: Fair  Akathisia:  No  Handed:  Right  AIMS (if indicated):     Assets:  Physical Health  ADL's:  Intact  Cognition: WNL  Sleep:  Number of Hours: 6.75     Current  Medications: Current Facility-Administered Medications  Medication Dose Route Frequency Provider Last Rate Last Dose  . acetaminophen (TYLENOL) tablet 650 mg  650 mg Oral Q6H PRN Fransisca KaufmannLaura Davis, NP   650 mg at 01/10/15 0836  . alum & mag hydroxide-simeth (MAALOX/MYLANTA) 200-200-20 MG/5ML suspension 30 mL  30 mL Oral Q4H PRN Fransisca KaufmannLaura Davis, NP      . benztropine (COGENTIN) tablet 0.5 mg  0.5 mg Oral BID Fransisca KaufmannLaura Davis, NP   0.5 mg at 01/13/15 0842  . divalproex (DEPAKOTE) DR tablet 500 mg  500 mg Oral Q12H Jomarie LongsSaramma Eappen, MD   500 mg at 01/13/15 0842  . feeding supplement (ENSURE COMPLETE) (ENSURE COMPLETE) liquid 237 mL  237 mL Oral BID BM Saramma Eappen, MD   237 mL at 01/12/15  1000  . haloperidol (HALDOL) tablet 10 mg  10 mg Oral QPM Saramma Eappen, MD   10 mg at 01/12/15 1813  . haloperidol (HALDOL) tablet 10 mg  10 mg Oral Daily Jomarie LongsSaramma Eappen, MD   10 mg at 01/13/15 0844  . magnesium hydroxide (MILK OF MAGNESIA) suspension 30 mL  30 mL Oral Daily PRN Fransisca KaufmannLaura Davis, NP      . OLANZapine zydis (ZYPREXA) disintegrating tablet 5 mg  5 mg Oral Q8H PRN Fransisca KaufmannLaura Davis, NP      . traZODone (DESYREL) tablet 50 mg  50 mg Oral QHS PRN Fransisca KaufmannLaura Davis, NP   50 mg at 01/11/15 2230    Lab Results:  No results found for this or any previous visit (from the past 48 hour(s)).  Physical Findings: AIMS: Facial and Oral Movements Muscles of Facial Expression: None, normal Lips and Perioral Area: None, normal Jaw: None, normal Tongue: None, normal,Extremity Movements Upper (arms, wrists, hands, fingers): None, normal Lower (legs, knees, ankles, toes): None, normal, Trunk Movements Neck, shoulders, hips: None, normal, Overall Severity Severity of abnormal movements (highest score from questions above): None, normal Incapacitation due to abnormal movements: None, normal Patient's awareness of abnormal movements (rate only patient's report): No Awareness, Dental Status Current problems with teeth and/or dentures?: No Does patient usually wear dentures?: No  CIWA:  CIWA-Ar Total: 0 COWS:  COWS Total Score: 3   Assessment:Patient is a [age] y old AAF with hx of psychosis , noncompliant on medications was IVCed for psychosis as well as having HI towards family members.     Treatment Plan Summary: Daily contact with patient to assess and evaluate symptoms and progress in treatment and Medication management Will continue Haldol 20 mg po daily for psychosis as well as mood sx. Plan to DC on LAI. Will continue Depakote DR 500 mg po q12h for mood lability.Depakote level in 1 day - 01/14/15 Will continue Trazodone 50 mg po qhs prn for sleep. Will make available medications for agitation as well  as anxiety. Will continue to monitor vitals ,medication compliance and treatment side effects while patient is here.  Will monitor for medical issues as well as call consult as needed.  Reviewed labs. CSW will start working on disposition.  Patient to participate in therapeutic milieu .    Medical Decision Making:  Review of Psycho-Social Stressors (1), Review or order clinical lab tests (1), Established Problem, Worsening (2), Review or order medicine tests (1), Review of Medication Regimen & Side Effects (2) and Review of New Medication or Change in Dosage (2)     Ancil LinseySARANGA, Marieliz Strang ,MD  01/13/2015, 1:03 PM

## 2015-01-14 LAB — VALPROIC ACID LEVEL: VALPROIC ACID LVL: 111.8 ug/mL — AB (ref 50.0–100.0)

## 2015-01-14 NOTE — Progress Notes (Signed)
Adult Psychoeducational Group Note  Date:  01/14/2015 Time:  9:01 PM  Group Topic/Focus:  Wrap-Up Group:   The focus of this group is to help patients review their daily goal of treatment and discuss progress on daily workbooks.  Participation Level:  Active  Participation Quality:  Appropriate  Affect:  Appropriate  Cognitive:  Appropriate  Insight: Appropriate  Engagement in Group:  Engaged  Modes of Intervention:  Discussion  Additional Comments:  The patient expressed that that her support [facility]  [facility].The patient also said that her day was ok.  Octavio Mannshigpen, Jimi Giza Lee 01/14/2015, 9:01 PM

## 2015-01-14 NOTE — [facility] Group Notes (Signed)
[facility] Group Notes:  (Clinical Social Work)  01/14/2015   11:15am-12:00pm  Summary of Progress/Problems:  The main focus of today's process group was to listen to a variety of genres of music and to identify that different types of music provoke different responses.  The patient then was able to identify personally what was soothing for them, as well as energizing.  Handouts were used to record feelings evoked, as well as how patient can personally use this knowledge in sleep habits, with depression, and with other symptoms.  The patient expressed understanding of concepts, as well as knowledge of how each type of music affected him/her and how this can be used at home as a wellness/recovery tool.  She smiled numerous times during group, was able to relate to much of the music and was quite able to identify her emotions.  Type of Therapy:  Music Therapy   Participation Level:  Active  Participation Quality:  Attentive and Sharing  Affect:  Blunted  Cognitive:  Oriented  Insight:  Engaged  Engagement in Therapy:  Engaged  Modes of Intervention:   Activity, Exploration  Ambrose MantleMareida Grossman-Orr, LCSW 01/14/2015, 12:30pm

## 2015-01-14 NOTE — Progress Notes (Signed)
D) Pt attends the groups but interacts minimally today. Withdraws to her room where she sleeps until meal times or groups. Stated "I didn't go to group last night because I was tired. Pt not as spontaneous today as yesterday. Affect is flat. Denies SI and HI and states she is "not hearing or seeing anything" Affect is flat and mood depressed. A) Attempts made at engaging Pt to attend the program and to attend meals. Given support and reassurance R) Pt denies SI and HI, hallucinations and delusions. Affect is flat and mood depressed

## 2015-01-14 NOTE — Progress Notes (Signed)
[facility] MD Progress Note  01/14/2015 5:46 PM Jamie Huber  MRN:  [id number] Subjective: Patient states "I have been feeling better every day". Daytime drowsiness resolving. Pt still isolating to room, not attending groups. Mood is better. Sleep/appetite improving. Pt wants to speak to attending psychiatrist about possible discharge tomorrow.    Objective:Patient seen and chart reviewed.Patient discussed with treatment team. Patient presented after being IVC ed for psychosis as well as HI towards family members. Patient wanted to get firearm to deal with these family members according of IVC petition.  She denies current SI/HI/AVH.  Per staff no disruptive issues noted on the unit .  Patient has been compliant on her medications. Depakote level 111.8 today. CSW will work on disposition. Patient lived with parents and was aggressive at home. Patient states her mother does not want her to return home.    Principal Problem: Bipolar disorder R/O Schizoaffective disorder Bipolar type verus Bipolar disorder type I  Diagnosis:   Patient Active Problem List   Diagnosis Date Noted  . Bipolar disorder [F31.9] 01/10/2015  . Psychosis [F29] 01/09/2015  . ATRIAL SEPTAL DEFECT, HX OF [Z86.79] 04/18/2009   Total Time spent with patient: 15 minutes   Past Medical History:  Past Medical History  Diagnosis Date  . Atrial septal defect   . Manic depression   . Anxiety   . Hypertension     Past Surgical History  Procedure Laterality Date  . Asd repair     Family History: History reviewed. No pertinent family history. Social History:  History  Alcohol Use  . Yes     History  Drug Use No    History   Social History  . Marital Status: Single    Spouse Name: N/A  . Number of Children: N/A  . Years of Education: N/A   Occupational History  . Student    Social History Main Topics  . Smoking status: Current Every Day Smoker    Types: Cigarettes  . Smokeless tobacco: Not on file  .  Alcohol Use: Yes  . Drug Use: No  . Sexual Activity: Yes    Birth Control/ Protection: IUD   Other Topics Concern  . None   Social History Narrative   Pregnant   Additional History:    Sleep: Fair  Appetite:  Fair      Musculoskeletal: Strength & Muscle Tone: within normal limits Gait & Station: normal Patient leans: N/A   Psychiatric Specialty Exam: Physical Exam  ROS  Blood pressure 115/76, pulse 87, temperature 98.7 F (37.1 C), temperature source Oral, resp. rate 18, height 5' 6.25" (1.683 m), weight 72.576 kg (160 lb), SpO2 99 %.Body mass index is 25.62 kg/(m^2).  General Appearance: Fairly Groomed and Guarded  Patent attorney::  Fair  Speech:  Normal Rate  Volume:  Normal  Mood:  Euthymic   Affect:  Blunt and Congruent  Thought Process:  Coherent and Goal Directed  Orientation:  Full (Time, Place, and Person)  Thought Content:  Paranoid Ideation. She denies AVH.  Suicidal Thoughts:  No  Homicidal Thoughts:  No  Memory:  Immediate;   Fair Recent;   Fair Remote;   Fair  Judgement:  Impaired  Insight:  Lacking  Psychomotor Activity:  Restlessness  Concentration:  Poor  Recall:  [employer] of Knowledge:Fair  Language: Fair  Akathisia:  No  Handed:  Right  AIMS (if indicated):     Assets:  Physical Health  ADL's:  Intact  Cognition: WNL  Sleep:  Number of Hours: 6.75     Current Medications: Current Facility-Administered Medications  Medication Dose Route Frequency Provider Last Rate Last Dose  . acetaminophen (TYLENOL) tablet 650 mg  650 mg Oral Q6H PRN Fransisca Kaufmann, NP   650 mg at 01/10/15 0836  . alum & mag hydroxide-simeth (MAALOX/MYLANTA) 200-200-20 MG/5ML suspension 30 mL  30 mL Oral Q4H PRN Fransisca Kaufmann, NP      . benztropine (COGENTIN) tablet 0.5 mg  0.5 mg Oral BID Fransisca Kaufmann, NP   0.5 mg at 01/14/15 1721  . divalproex (DEPAKOTE) DR tablet 500 mg  500 mg Oral Q12H Jomarie Longs, MD   500 mg at 01/14/15 0758  . feeding supplement (ENSURE  COMPLETE) (ENSURE COMPLETE) liquid 237 mL  237 mL Oral BID BM Saramma Eappen, MD   237 mL at 01/12/15 1000  . haloperidol (HALDOL) tablet 10 mg  10 mg Oral QPM Saramma Eappen, MD   10 mg at 01/14/15 1721  . haloperidol (HALDOL) tablet 10 mg  10 mg Oral Daily Jomarie Longs, MD   10 mg at 01/14/15 0758  . magnesium hydroxide (MILK OF MAGNESIA) suspension 30 mL  30 mL Oral Daily PRN Fransisca Kaufmann, NP      . OLANZapine zydis (ZYPREXA) disintegrating tablet 5 mg  5 mg Oral Q8H PRN Fransisca Kaufmann, NP      . traZODone (DESYREL) tablet 50 mg  50 mg Oral QHS PRN Fransisca Kaufmann, NP   50 mg at 01/11/15 2230    Lab Results:  Results for orders placed or performed during the hospital encounter of 01/09/15 (from the past 48 hour(s))  Valproic acid level     Status: Abnormal   Collection Time: 01/14/15  6:32 AM  Result Value Ref Range   Valproic Acid Lvl 111.8 (H) 50.0 - 100.0 ug/mL    Comment: Performed at [facility]    Physical Findings: AIMS: Facial and Oral Movements Muscles of Facial Expression: None, normal Lips and Perioral Area: None, normal Jaw: None, normal Tongue: None, normal,Extremity Movements Upper (arms, wrists, hands, fingers): None, normal Lower (legs, knees, ankles, toes): None, normal, Trunk Movements Neck, shoulders, hips: None, normal, Overall Severity Severity of abnormal movements (highest score from questions above): None, normal Incapacitation due to abnormal movements: None, normal Patient's awareness of abnormal movements (rate only patient's report): No Awareness, Dental Status Current problems with teeth and/or dentures?: No Does patient usually wear dentures?: No  CIWA:  CIWA-Ar Total: 0 COWS:  COWS Total Score: 3   Assessment:Patient is a 89 y old AAF with hx of psychosis , noncompliant on medications was IVCed for psychosis as well as having HI towards family members.     Treatment Plan Summary: Daily contact with patient to assess and evaluate symptoms and  progress in treatment and Medication management Will continue Haldol 20 mg po daily for psychosis as well as mood sx. Plan to DC on LAI. Will continue Depakote DR 500 mg po q12h for mood lability.Depakote level 111.8 today. Will continue Trazodone 50 mg po qhs prn for sleep. Will make available medications for agitation as well as anxiety. Will continue to monitor vitals ,medication compliance and treatment side effects while patient is here.  Will monitor for medical issues as well as call consult as needed.  Reviewed labs. CSW will start working on disposition.  Patient to participate in therapeutic milieu .    Medical Decision Making:  Review of Psycho-Social Stressors (1), Review or order clinical lab  tests (1), Established Problem, Worsening (2), Review or order medicine tests (1), Review of Medication Regimen & Side Effects (2) and Review of New Medication or Change in Dosage (2)     Ancil LinseySARANGA, Brittanee Ghazarian ,MD  01/14/2015, 5:46 PM

## 2015-01-15 DIAGNOSIS — F250 Schizoaffective disorder, bipolar type: Principal | ICD-10-CM

## 2015-01-15 MED ORDER — DIVALPROEX SODIUM 250 MG PO DR TAB: DELAYED_RELEASE_TABLET | ORAL | Status: DC

## 2015-01-15 MED ORDER — BENZTROPINE MESYLATE 1 MG/ML IJ SOLN: 0.5000 mg | INTRAMUSCULAR | Status: DC

## 2015-01-15 MED ORDER — TRAZODONE HCL 50 MG PO TABS: 50.0000 mg | ORAL_TABLET | Freq: Every evening | ORAL | Status: DC | PRN

## 2015-01-15 MED ORDER — DIVALPROEX SODIUM 250 MG PO DR TAB
DELAYED_RELEASE_TABLET | ORAL | Status: AC
Start: 2015-01-15 — End: 2015-01-15
  Filled 2015-01-15: qty 1

## 2015-01-15 MED ORDER — HALOPERIDOL DECANOATE 100 MG/ML IM SOLN
100.0000 mg | INTRAMUSCULAR | Status: DC
  Administered 2015-01-15: 100 mg via INTRAMUSCULAR
  Filled 2015-01-15: qty 1

## 2015-01-15 MED ORDER — NICOTINE 21 MG/24HR TD PT24
21.0000 mg | MEDICATED_PATCH | Freq: Every day | TRANSDERMAL | Status: DC
  Administered 2015-01-15: 21 mg via TRANSDERMAL
  Filled 2015-01-15 (×3): qty 1

## 2015-01-15 MED ORDER — BENZTROPINE MESYLATE 0.5 MG PO TABS: 0.5000 mg | ORAL_TABLET | Freq: Two times a day (BID) | ORAL | Status: DC

## 2015-01-15 MED ORDER — HALOPERIDOL DECANOATE 100 MG/ML IM SOLN: 100.0000 mg | INTRAMUSCULAR | Status: DC

## 2015-01-15 MED ORDER — HALOPERIDOL 10 MG PO TABS: ORAL_TABLET | ORAL | Status: DC

## 2015-01-15 MED ORDER — BENZTROPINE MESYLATE 1 MG/ML IJ SOLN
0.5000 mg | INTRAMUSCULAR | Status: DC
  Administered 2015-01-15: 0.5 mg via INTRAMUSCULAR
  Filled 2015-01-15: qty 0.5

## 2015-01-15 MED ORDER — DIVALPROEX SODIUM 250 MG PO DR TAB
250.0000 mg | DELAYED_RELEASE_TABLET | ORAL | Status: DC
  Administered 2015-01-15: 250 mg via ORAL
  Filled 2015-01-15: qty 1

## 2015-01-15 MED ORDER — DIVALPROEX SODIUM 500 MG PO DR TAB
500.0000 mg | DELAYED_RELEASE_TABLET | Freq: Every day | ORAL | Status: DC
  Filled 2015-01-15: qty 1

## 2015-01-15 MED ORDER — DIVALPROEX SODIUM 500 MG PO DR TAB: 500.0000 mg | DELAYED_RELEASE_TABLET | ORAL | Status: DC

## 2015-01-15 NOTE — Discharge Summary (Signed)
Physician Discharge Summary Note  Patient:  Jamie Huber is an 29 y.o., female MRN:  [id number] DOB:  1986-01-26 Patient phone:  [phone] (home)  Patient address:   [address] [place] [address],  Total Time spent with patient: Greater than 30 minutes  Date of Admission:  01/09/2015  Date of Discharge: 01-15-15  Reason for Admission: Psychosis  Principal Problem: Schizoaffective disorder, bipolar type Discharge Diagnoses: Patient Active Problem List   Diagnosis Date Noted  . Schizoaffective disorder, bipolar type [F25.0] 01/15/2015  . ATRIAL SEPTAL DEFECT, HX OF [Z86.79] 04/18/2009   Musculoskeletal: Strength & Muscle Tone: within normal limits Gait & Station: normal Patient leans: N/A  Psychiatric Specialty Exam: Physical Exam  Psychiatric: Her speech is normal and behavior is normal. Judgment and thought content normal. Her mood appears not anxious. Her affect is not angry, not blunt, not labile and not inappropriate. Cognition and memory are normal. She does not exhibit a depressed mood.    Review of Systems  Constitutional: Negative.   HENT: Negative.   Eyes: Negative.   Respiratory: Negative.   Cardiovascular: Negative.   Gastrointestinal: Negative.   Genitourinary: Negative.   Musculoskeletal: Negative.   Skin: Negative.   Neurological: Negative.   Endo/Heme/Allergies: Negative.   Psychiatric/Behavioral: Positive for depression (Stable) and hallucinations (Hx of). Negative for suicidal ideas, memory loss and substance abuse. The patient has insomnia (Stable). The patient is not nervous/anxious.     Blood pressure 102/59, pulse 100, temperature 98.5 F (36.9 C), temperature source Oral, resp. rate 18, height 5' 6.25" (1.683 m), weight 72.576 kg (160 lb), SpO2 99 %.Body mass index is 25.62 kg/(m^2).  See MD's SRA   ast Medical History:  Past Medical History  Diagnosis Date  . Atrial septal defect   . Manic depression   . Anxiety   . Hypertension      Past Surgical History  Procedure Laterality Date  . Asd repair     Family History: History reviewed. No pertinent family history. Social History:  History  Alcohol Use  . Yes     History  Drug Use No    History   Social History  . Marital Status: Single    Spouse Name: N/A  . Number of Children: N/A  . Years of Education: N/A   Occupational History  . Student    Social History Main Topics  . Smoking status: Current Every Day Smoker    Types: Cigarettes  . Smokeless tobacco: Not on file  . Alcohol Use: Yes  . Drug Use: No  . Sexual Activity: Yes    Birth Control/ Protection: IUD   Other Topics Concern  . None   Social History Narrative   Pregnant   Risk to Self: Is patient at risk for suicide?: No  Risk to Others: NA  Prior Inpatient Therapy: NA  Prior Outpatient Therapy: NA  Level of Care:  OP  Hospital Course:  Jamie Huber is a 29 year old female who was brought to [facility] under IVC paperwork filed by parents. The paperwork indicated that the patient has a psychotic disorder and stopped her medications eight months ago. Per the IVC paperwork the patient has been pacing at night, locking her parents out of house, and delusional regarding there being monitoring equipment in the home. The patient is also reported to have talked about obtaining a gun permit to harm various relatives with. The patient appears very psychotic during her assessment today and is a poor historian. She stated  the following during assessment "There is a lot of drama going on. I wish people would leave me alone. It's my business if I delete people off my face-book account. They think I work for the police or something. I just wish that my mother would stop nagging me. I am trying to do a good job in life."   Jamie Huber was admitted to the adult unit for psychosis related to being off of her medicines x 8 months prior. During her admission assessment, she was evaluated and her symptoms  identified. Medication management was discussed and initiated targeting her presenting symptoms. She was oriented to the unit and encouraged to participate in the unit programming. She presented no other pre-existing health issues that required treatments. However, she was monitored closely for any potential problems she may experience related to her treatment regimen. She tolerated her treatment regimen without any significant adverse effects and or reactions.  While receiving treatment in this hospital, Jamie Huber was evaluated each day by a clinical provider to assure her symptoms's were responding to her treatment regimen. As the day goes by, improvement was noted by her reports of decreasing symptoms, improved sleep, appetite, affect, medication tolerance, behavior, and participation in unit programming. She was required on daily basis to complete a self inventory asssessment noting mood, mental status, pain, any new symptoms, anxiety and or concerns.  Jamie Huber's symptoms responded well to her treatment regimen.  Being in a therapeutic and supportive environment also assisted in her progress. She did present appropriate behavior & was motivated for recovery as well. She worked closely with the treatment team and case manager to develop a discharge plan with appropriate goals to maintain mood stability after discharge. Coping skills, problem solving as well as relaxation therapies were also part of her unit programming.  Jamie Huber is stable for discharge to continue psychiatric treatment on outpatient basis. On this day of her hospital discharge, she is in much improved condition than upon admission. Her symptoms were reported as significantly decreased or resolved completely. Upon discharge, she denies SI/HI and voiced no AVH. She was motivated to continue taking medication with a goal of continued improvement in mental health. Jamie Huber is discharged home with a plan to follow up as noted below. She received her  Haldol Injectable on 01-15-15 due to be administered again on 02-15-15. Other medications discharged on include;  Depakote DR 250 mg in am & 500 mg Q hs, Haldol 10 mg bid for mood control, Cogentin injectable 1 mg/ml due 02-15-15, Cogentin 0.5 mg bid for EPS & Trazodone 50 mg Q hs for insomnia. Current Depakote levels : 111.8  Consults:  psychiatry  Significant Diagnostic Studies:  labs: CBC with diff, CMP, UDS, toxicology tests, U/A, depakote levels, results reviewed, stable  Discharge Vitals:   Blood pressure 102/59, pulse 100, temperature 98.5 F (36.9 C), temperature source Oral, resp. rate 18, height 5' 6.25" (1.683 m), weight 72.576 kg (160 lb), SpO2 99 %. Body mass index is 25.62 kg/(m^2). Lab Results:   Results for orders placed or performed during the hospital encounter of 01/09/15 (from the past 72 hour(s))  Valproic acid level     Status: Abnormal   Collection Time: 01/14/15  6:32 AM  Result Value Ref Range   Valproic Acid Lvl 111.8 (H) 50.0 - 100.0 ug/mL    Comment: Performed at [facility]    Physical Findings: AIMS: Facial and Oral Movements Muscles of Facial Expression: None, normal Lips and Perioral Area: None, normal Jaw: None, normal  Tongue: None, normal,Extremity Movements Upper (arms, wrists, hands, fingers): None, normal Lower (legs, knees, ankles, toes): None, normal, Trunk Movements Neck, shoulders, hips: None, normal, Overall Severity Severity of abnormal movements (highest score from questions above): None, normal Incapacitation due to abnormal movements: None, normal Patient's awareness of abnormal movements (rate only patient's report): No Awareness, Dental Status Current problems with teeth and/or dentures?: No Does patient usually wear dentures?: No  CIWA:  CIWA-Ar Total: 0 COWS:  COWS Total Score: 3  See Psychiatric Specialty Exam and Suicide Risk Assessment completed by Attending Physician prior to discharge.  Discharge destination:   Home  Is patient on multiple antipsychotic therapies at discharge:  No   Has Patient had three or more failed trials of antipsychotic monotherapy by history:  No  Recommended Plan for Multiple Antipsychotic Therapies: NA    Medication List    TAKE these medications      Indication   benztropine 0.5 MG tablet  Commonly known as:  COGENTIN  Take 1 tablet (0.5 mg total) by mouth 2 (two) times daily. For prevention of drug induced tremors   Indication:  Extrapyramidal Reaction caused by Medications     benztropine mesylate 1 MG/ML injection  Commonly known as:  COGENTIN  Inject 0.5 mLs (0.5 mg total) into the muscle every 30 (thirty) days. (Due to be administered on 02-15-15): For prevention of drug induced tremors   Indication:  Extrapyramidal Reaction caused by Medications     divalproex 250 MG DR tablet  Commonly known as:  DEPAKOTE  Take 1 tablet (250 mg) in the morning & 2 tablets (500 mg) at bedtime: For mood stabilization   Indication:  Mood stabilization     haloperidol 10 MG tablet  Commonly known as:  HALDOL  Take 1 tablet (10 mg) twice daily: For mood control   Indication:  Mood control     haloperidol decanoate 100 MG/ML injection  Commonly known as:  HALDOL DECANOATE  Inject 1 mL (100 mg total) into the muscle every 30 (thirty) days. (Due to be administered on 02-15-15): For mood control   Indication:  Mood control     traZODone 50 MG tablet  Commonly known as:  DESYREL  Take 1 tablet (50 mg total) by mouth at bedtime as needed for sleep.   Indication:  Trouble Sleeping       Follow-up recommendations:  Activity:  As tolerated Diet: As recommended by your primary care doctor. Keep all scheduled follow-up appointments as recommended.  Comments: Take all your medications as prescribed by your mental healthcare provider. Report any adverse effects and or reactions from your medicines to your outpatient provider promptly. Patient is instructed and cautioned to  not engage in alcohol and or illegal drug use while on prescription medicines. In the event of worsening symptoms, patient is instructed to call the crisis hotline, 911 and or go to the nearest ED for appropriate evaluation and treatment of symptoms. Follow-up with your primary care provider for your other medical issues, concerns and or health care needs.   Total Discharge Time: Greater than 30 minutes  Signed: Sanjuana KavaNwoko, Dakia Schifano I, PMHNP, FNP-BC 01/15/2015, 10:40 AM

## 2015-01-15 NOTE — Progress Notes (Signed)
Patient isolative to room, bed. Forwards little information 1:1 but is cooperative and calm. Affect is flat, mood slightly depressed. Medicated per orders and education provided. Patient supported. Reminded of fall precautions as patient experienced fall on Thursday. Haldol Dec given along with cogentin IM. Patient denies AVH and remains safe. Lawrence MarseillesFriedman, Krystin Keeven Eakes

## 2015-01-15 NOTE — [facility] Suicide Risk Assessment (Signed)
[facility]   Demographic Factors:  Unemployed  Total Time spent with patient: 30 minutes  Musculoskeletal: Strength & Muscle Tone: within normal limits Gait & Station: normal Patient leans: N/A  Psychiatric Specialty Exam: Physical Exam  Review of Systems  Psychiatric/Behavioral: Negative for depression, suicidal ideas, hallucinations and substance abuse.    Blood pressure 102/59, pulse 100, temperature 98.5 F (36.9 C), temperature source Oral, resp. rate 18, height 5' 6.25" (1.683 m), weight 72.576 kg (160 lb), SpO2 99 %.Body mass index is 25.62 kg/(m^2).  General Appearance: Fairly Groomed  Patent attorneyye Contact::  Fair  Speech:  Clear and Coherent409  Volume:  Normal  Mood:  Euthymic  Affect:  Appropriate  Thought Process:  Goal Directed  Orientation:  Full (Time, Place, and Person)  Thought Content:  WDL  Suicidal Thoughts:  No  Homicidal Thoughts:  No  Memory:  Immediate;   Fair Recent;   Fair Remote;   Fair  Judgement:  Fair  Insight:  Shallow  Psychomotor Activity:  Normal  Concentration:  Fair  Recall:  [employer]  Fund of Knowledge:Fair  Language: Fair  Akathisia:  No  Handed:  Right  AIMS (if indicated):     Assets:  Desire for Improvement Physical Health Social Support  Sleep:  Number of Hours: 6.75  Cognition: WNL  ADL's:  Intact   Have you used any form of tobacco in the last 30 days? (Cigarettes, Smokeless Tobacco, Cigars, and/or Pipes): Yes  Has this patient used any form of tobacco in the last 30 days? (Cigarettes, Smokeless Tobacco, Cigars, and/or Pipes) Yes, Prescription provided for nicotine patch.  Mental Status Per Nursing Assessment::   On Admission:     Current Mental Status by Physician: Patient denies any AH/VH/SI/HI/Paranoia or delusions. Patient is withdrawn at baseline , no disruptive issues noted on unit, agrees to LAI .  Loss Factors: NA  Historical Factors: Impulsivity  Risk Reduction Factors:   Living with  another person, especially a relative and Positive social support  Continued Clinical Symptoms:  Previous Psychiatric Diagnoses and Treatments  Cognitive Features That Contribute To Risk:  Closed-mindedness    Suicide Risk:  Minimal: No identifiable suicidal ideation.  Patients presenting with no risk factors but with morbid ruminations; may be classified as minimal risk based on the severity of the depressive symptoms  Principal Problem: Schizoaffective disorder, bipolar type Discharge Diagnoses:  Patient Active Problem List   Diagnosis Date Noted  . Schizoaffective disorder, bipolar type [F25.0] 01/15/2015  . ATRIAL SEPTAL DEFECT, HX OF [Z86.79] 04/18/2009      Plan Of Care/Follow-up recommendations:  Activity:  No restrictions Diet:  regular Tests:  as needed Other:  follow up with after care  Is patient on multiple antipsychotic therapies at discharge:  No   Has Patient had three or more failed trials of antipsychotic monotherapy by history:  No  Recommended Plan for Multiple Antipsychotic Therapies: NA    Dhruva Orndoff MD 01/15/2015, 9:46 AM

## 2015-01-15 NOTE — [facility] Suicide Risk Assessment (Signed)
[facility] INPATIENT:  Family/Significant Other Suicide Prevention Education  Suicide Prevention Education:  Education Completed; Electa Sniffatricia Molina, mother, [phone], [phone] cell has been identified by the patient as the family member/significant other with whom the patient will be residing, and identified as the person(s) who will aid the patient in the event of a mental health crisis (suicidal ideations/suicide attempt).  With written consent from the patient, the family member/significant other has been provided the following suicide prevention education, prior to the and/or following the discharge of the patient.  The suicide prevention education provided includes the following:  Suicide risk factors  Suicide prevention and interventions  National Suicide Hotline telephone number  [facility] assessment telephone number  [employer] [place] Emergency Assistance 911  [facility] and/or Residential Mobile Crisis Unit telephone number  Request made of family/significant other to:  Remove weapons (e.g., guns, rifles, knives), all items previously/currently identified as safety concern.    Remove drugs/medications (over-the-counter, prescriptions, illicit drugs), all items previously/currently identified as a safety concern.  The family member/significant other verbalizes understanding of the suicide prevention education information provided.  The family member/significant other agrees to remove the items of safety concern listed above.  Daryel Geraldorth, Anwar Sakata B 01/15/2015, 11:38 AM

## 2015-01-15 NOTE — Progress Notes (Signed)
  [facility] :  Will you be returning to the same living situation after discharge:  Yes,  home At discharge, do you have transportation home?: Yes,  family Do you have the ability to pay for your medications: Yes,  mental health  Release of information consent forms completed and in the chart;  Patient's signature needed at discharge.  Patient to Follow up at: Follow-up Information    Follow up with [facility] On 01/17/2015.   Why:  Go to the walk-in clinic between 8 and 11 AM on Wednesday for your hospital follow up appointment   Contact information:   [address] [place]  [phone]      Patient denies SI/HI: Yes,  yes    Safety Planning and Suicide Prevention discussed: Yes,  yes  Have you used any form of tobacco in the last 30 days? (Cigarettes, Smokeless Tobacco, Cigars, and/or Pipes): Yes  Has patient been referred to the Quitline?: Yes, faxed on 01/15/15  Ida Rogueorth, Johannes Everage B 01/15/2015, 11:36 AM

## 2015-01-15 NOTE — Progress Notes (Signed)
Patient packed and parents here for pick up. Discharge teaching completed, Rx's given along with sample meds. Belongings returned. Patient verbalizes understanding. Denies SI/HI/AVH and is discharged to parents in stable condition. Lawrence MarseillesFriedman, Arnetta Odeh Eakes

## 2015-01-17 NOTE — Progress Notes (Signed)
Patient Discharge Instructions:  After Visit Summary (AVS):   Faxed to:  01/17/15 Discharge Summary Note:   Faxed to:  01/17/15 Psychiatric Admission Assessment Note:   Faxed to:  01/17/15 Suicide Risk Assessment - Discharge Assessment:   Faxed to:  01/17/15 Faxed/Sent to the Next Level Care provider:  01/17/[facility] [phone]   Jerelene ReddenSheena E Meriwether, 01/17/2015, 3:58 PM

## 2016-03-18 IMAGING — DX DG CHEST 2V
2 series · 2 of 2 positions shown · non-contrast
Comparison: None.

CLINICAL DATA: Medical clearance, cough

EXAM:
CHEST  2 VIEW

[chest pa]
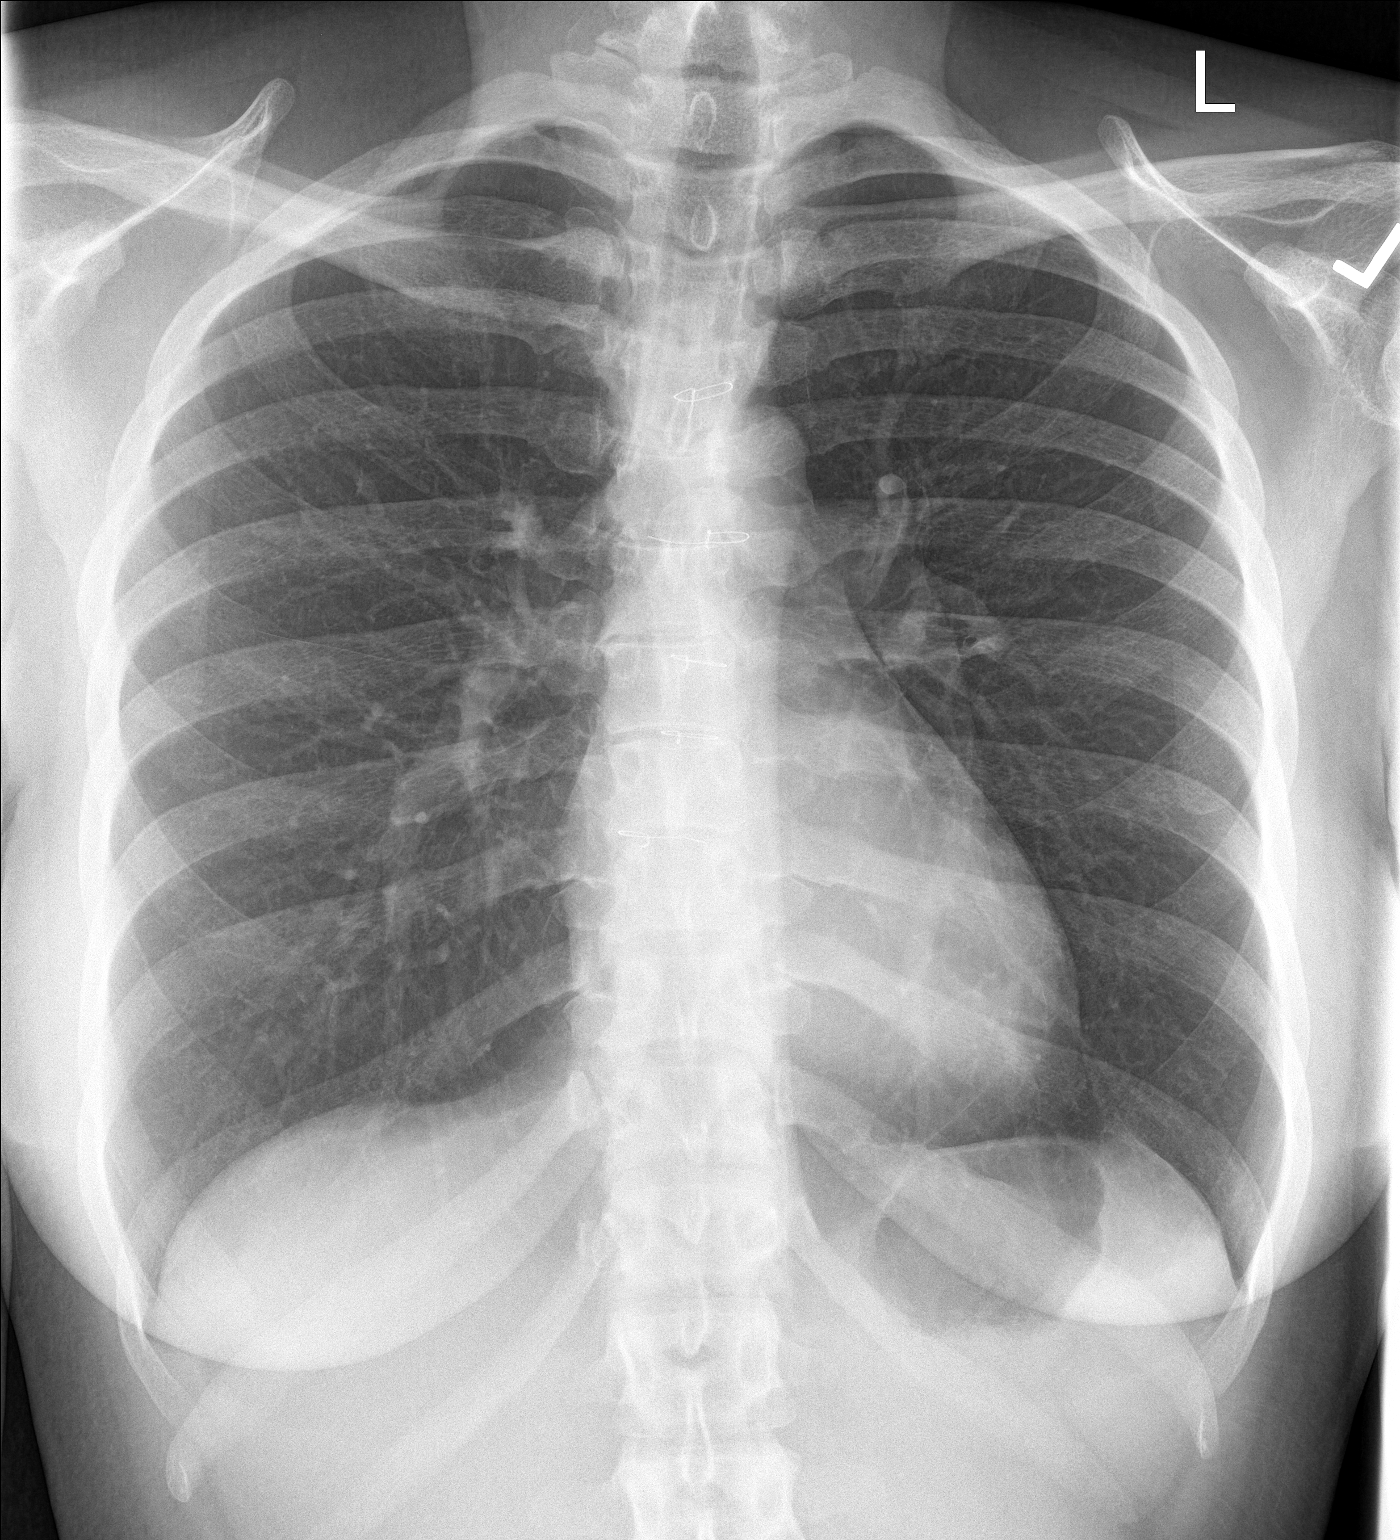

[chest lat]
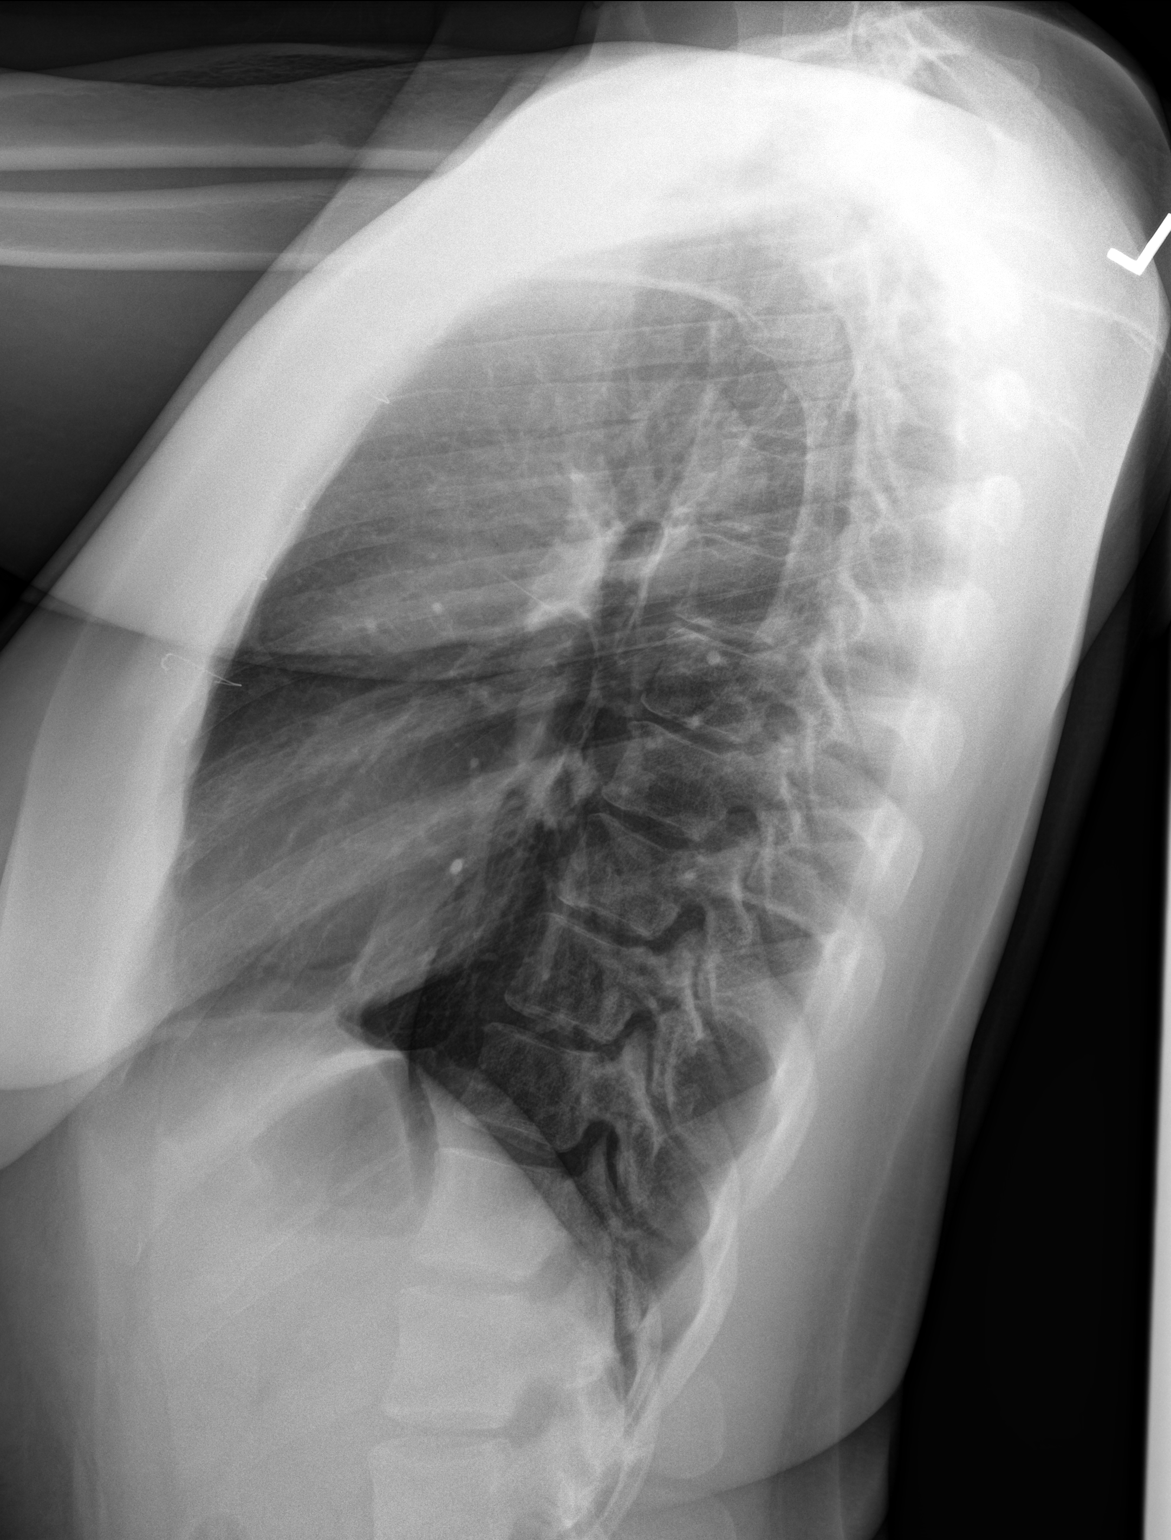

[2 of 2 positions shown; findings below may reference images not displayed]

FINDINGS: The heart size and mediastinal contours are within normal limits.
Both lungs are clear. The visualized skeletal structures are
unremarkable.
IMPRESSION: No active cardiopulmonary disease.

## 2017-07-16 ENCOUNTER — Other Ambulatory Visit (HOSPITAL_COMMUNITY): Payer: Self-pay | Admitting: *Deleted

## 2017-07-16 DIAGNOSIS — E049 Nontoxic goiter, unspecified: Secondary | ICD-10-CM

## 2017-08-07 ENCOUNTER — Ambulatory Visit (HOSPITAL_COMMUNITY)
Admission: RE | Admit: 2017-08-07 | Discharge: 2017-08-07 | Disposition: A | Discharge status: Home / Self Care | Payer: Medicaid Other | Source: Ambulatory Visit | Attending: *Deleted | Admitting: *Deleted

## 2017-08-07 DIAGNOSIS — E049 Nontoxic goiter, unspecified: Secondary | ICD-10-CM | POA: Diagnosis not present

## 2022-05-20 ENCOUNTER — Other Ambulatory Visit (HOSPITAL_COMMUNITY)
Admission: RE | Admit: 2022-05-20 | Discharge: 2022-05-20 | Disposition: A | Discharge status: Home / Self Care | Payer: Managed Care, Other (non HMO) | Source: Ambulatory Visit | Attending: Nurse Practitioner | Admitting: Nurse Practitioner

## 2022-05-20 DIAGNOSIS — R87610 Atypical squamous cells of undetermined significance on cytologic smear of cervix (ASC-US): Secondary | ICD-10-CM | POA: Insufficient documentation

## 2022-05-23 LAB — SURGICAL PATHOLOGY

## 2022-07-03 ENCOUNTER — Telehealth: Payer: Self-pay

## 2022-07-03 NOTE — Telephone Encounter (Signed)
Per referral per [facility], Patient was contacted and [facility] medicaid (Patient Navigator) completed, needs to have LEEP procedure, colposcopy was completed outside of [facility].

## 2022-08-15 DIAGNOSIS — Z720 Tobacco use: Secondary | ICD-10-CM | POA: Diagnosis not present

## 2022-08-15 DIAGNOSIS — Z599 Problem related to housing and economic circumstances, unspecified: Secondary | ICD-10-CM | POA: Diagnosis not present

## 2022-08-15 DIAGNOSIS — Z6841 Body Mass Index (BMI) 40.0 and over, adult: Secondary | ICD-10-CM | POA: Diagnosis not present

## 2022-08-15 DIAGNOSIS — Z8249 Family history of ischemic heart disease and other diseases of the circulatory system: Secondary | ICD-10-CM | POA: Diagnosis not present

## 2022-08-15 DIAGNOSIS — Z791 Long term (current) use of non-steroidal anti-inflammatories (NSAID): Secondary | ICD-10-CM | POA: Diagnosis not present

## 2022-08-15 DIAGNOSIS — Z818 Family history of other mental and behavioral disorders: Secondary | ICD-10-CM | POA: Diagnosis not present

## 2022-08-15 DIAGNOSIS — Z809 Family history of malignant neoplasm, unspecified: Secondary | ICD-10-CM | POA: Diagnosis not present

## 2022-08-15 DIAGNOSIS — Z825 Family history of asthma and other chronic lower respiratory diseases: Secondary | ICD-10-CM | POA: Diagnosis not present

## 2022-08-15 DIAGNOSIS — R69 Illness, unspecified: Secondary | ICD-10-CM | POA: Diagnosis not present

## 2022-08-15 DIAGNOSIS — C539 Malignant neoplasm of cervix uteri, unspecified: Secondary | ICD-10-CM | POA: Diagnosis not present

## 2022-09-04 DIAGNOSIS — M549 Dorsalgia, unspecified: Secondary | ICD-10-CM | POA: Diagnosis not present

## 2022-09-04 DIAGNOSIS — E559 Vitamin D deficiency, unspecified: Secondary | ICD-10-CM | POA: Diagnosis not present

## 2022-09-04 DIAGNOSIS — M419 Scoliosis, unspecified: Secondary | ICD-10-CM | POA: Diagnosis not present

## 2022-09-04 DIAGNOSIS — I10 Essential (primary) hypertension: Secondary | ICD-10-CM | POA: Diagnosis not present

## 2022-09-17 DIAGNOSIS — N871 Moderate cervical dysplasia: Secondary | ICD-10-CM | POA: Diagnosis not present

## 2022-09-17 DIAGNOSIS — N870 Mild cervical dysplasia: Secondary | ICD-10-CM | POA: Diagnosis not present

## 2023-03-16 NOTE — H&P (Signed)
  Patient: Jamie Huber  PID: [id number]  DOB: July 09, 1986  SEX: Female   Patient referred by  DDS for extraction teeth #1, 4, 12, 14, 32.  CC: Pain lower right tooth.  Past Medical History:  Snoring, Smoker, Cancer, Morbid Obesity, HTN    Medications: Hydroxyzine, methocarbamol, Losartan, Olanzapine    Allergies:     Penicillin    Surgeries:   Cancer Removal, Heart Surgery, Oral Surgery     Social History       Smoking:1/2 ppd            Alcohol: Drug use:                             Exam: BMI  42. Caries teeth # 1, 4, 12, 14, 32. No purulence, edema, fluctuance, trismus. Oral cancer screening negative. Pharynx clear. No lymphadenopathy.  Panorex: Caries teeth # 1, 4, 12, 14, 32.   Assessment: ASA . Non-restorable teeth # 1, 4, 12, 14, 32.               Plan: 1. MD Clearance obtained  2.  Extraction Teeth # 1, 4, 12, 14, 32.   Hospital Day surgery.                 Rx: n               Risks and complications explained. Questions answered.   Georgia Lopes, DMD

## 2023-03-18 ENCOUNTER — Other Ambulatory Visit: Payer: Self-pay

## 2023-03-18 ENCOUNTER — Encounter (HOSPITAL_COMMUNITY): Payer: Self-pay | Admitting: Oral Surgery

## 2023-03-18 NOTE — Progress Notes (Signed)
SDW call  Patient was given pre-op instructions over the phone. Patient verbalized understanding of instructions provided.    PCP - [facility] Cardiologist - Denies Pulmonary: Denies   PPM/ICD - Denies  Chest x-ray - na EKG -  DOS, 03/20/2023 Stress Test - ECHO -  Cardiac Cath -   Sleep Study/sleep apnea/CPAP: Denies  Non-diabetic  Blood Thinner Instructions: Denies Aspirin Instructions:Denies   ERAS Protcol - No, NPO PRE-SURGERY Ensure or G2-    COVID TEST- n/a   Anesthesia review: Yes: HTN, ASD repair, morbid obesity   Patient denies shortness of breath, fever, cough and chest pain over the phone call  Your procedure is scheduled on Friday Mar 20, 2023  Report to [facility] Main Entrance "A" at 0615  A.M., then check in with the Admitting office.  Call this number if you have problems the morning of surgery:  [phone]   If you have any questions prior to your surgery date call [phone]: Open Monday-Friday 8am-4pm If you experience any cold or flu symptoms such as cough, fever, chills, shortness of breath, etc. between now and your scheduled surgery, please notify us at the above number     Remember:  Do not eat or drink after midnight the night before your surgery   Take these medicines the morning of surgery with A SIP OF WATER: None  As of today, STOP taking any Aspirin (unless otherwise instructed by your surgeon) Aleve, Naproxen, Ibuprofen, Motrin, Advil, Goody's, BC's, all herbal medications, fish oil, and all vitamins.

## 2023-03-19 ENCOUNTER — Encounter (HOSPITAL_COMMUNITY): Payer: Self-pay | Admitting: Oral Surgery

## 2023-03-19 NOTE — Anesthesia Preprocedure Evaluation (Addendum)
Anesthesia Evaluation  Patient identified by MRN, date of birth, ID band Patient awake    Reviewed: Allergy & Precautions, NPO status , Patient's Chart, lab work & pertinent test results  History of Anesthesia Complications Negative for: history of anesthetic complications  Airway Mallampati: III  TM Distance: >3 FB Neck ROM: Full    Dental  (+) Dental Advisory Given   Pulmonary neg shortness of breath, neg sleep apnea, neg COPD, neg recent URI, Current Smoker   Pulmonary exam normal breath sounds clear to auscultation       Cardiovascular hypertension (losartan), Pt. on medications (-) angina (-) Past MI, (-) Cardiac Stents and (-) CABG (-) dysrhythmias  Rhythm:Regular Rate:Normal  ASD s/p repair 1993   Neuro/Psych  PSYCHIATRIC DISORDERS Anxiety  Bipolar Disorder Schizophrenia  negative neurological ROS     GI/Hepatic negative GI ROS, Neg liver ROS,,,  Endo/Other  neg diabetes  Morbid obesity  Renal/GU negative Renal ROS     Musculoskeletal   Abdominal  (+) + obese  Peds  Hematology negative hematology ROS (+)   Anesthesia Other Findings   Reproductive/Obstetrics                             Anesthesia Physical Anesthesia Plan  ASA: 3  Anesthesia Plan: General   Post-op Pain Management: Tylenol PO (pre-op)*   Induction: Intravenous  PONV Risk Score and Plan: 2 and Ondansetron, Dexamethasone and Treatment may vary due to age or medical condition  Airway Management Planned: Nasal ETT  Additional Equipment:   Intra-op Plan:   Post-operative Plan: Extubation in OR  Informed Consent: I have reviewed the patients History and Physical, chart, labs and discussed the procedure including the risks, benefits and alternatives for the proposed anesthesia with the patient or authorized representative who has indicated his/her understanding and acceptance.     Dental advisory  given  Plan Discussed with: Anesthesiologist and CRNA  Anesthesia Plan Comments: (PAT note by Antionette Poles, PA-C:  37 yo female with hx of HTN, anxiety, manic depression, and reported ASD repair at 37 yo at [facility]. She does not currently follow with cardiology. She was last seen by Dr. Valera Castle 04/24/2009. Per note, "ASSESSMENT/PLAN:  A 37 year old female with a history of possible ASD  repair at age 53 with recent saline contrast echocardiogram demonstrating no right-to-left shunt.  She requires no further cardiac workup at this time.  She was also interviewed and examined by Dr. Daleen Squibb.  She can follow up with cardiology on a p.r.n. basis."  Pt will need DOS labs and evaluation.   Risks of general anesthesia discussed including, but not limited to, sore throat, hoarse voice, chipped/damaged teeth, injury to vocal cords, nausea and vomiting, allergic reactions, lung infection, heart attack, stroke, and death. All questions answered.    )        Anesthesia Quick Evaluation

## 2023-03-19 NOTE — Progress Notes (Signed)
Anesthesia Chart Review:  37 yo female with hx of HTN, anxiety, manic depression, and reported ASD repair at 37 yo at [facility]. She does not currently follow with cardiology. She was last seen by Dr. Valera Castle 04/24/2009. Per note, "ASSESSMENT/PLAN:  A 37 year old female with a history of possible ASD  repair at age 61 with recent saline contrast echocardiogram demonstrating no right-to-left shunt.  She requires no further cardiac workup at this time.  She was also interviewed and examined by Dr. Daleen Squibb.  She can follow up with cardiology on a p.r.n. basis."  Pt will need DOS labs and evaluation.     Jamie Huber [facility] Short Stay Center/Anesthesiology Phone [phone] 03/19/2023 9:53 AM

## 2023-03-20 ENCOUNTER — Encounter (HOSPITAL_COMMUNITY): Payer: Self-pay | Admitting: Oral Surgery

## 2023-03-20 ENCOUNTER — Ambulatory Visit (HOSPITAL_COMMUNITY)
Admission: RE | Admit: 2023-03-20 | Discharge: 2023-03-20 | Disposition: A | Discharge status: Home / Self Care | Payer: Medicaid Other | Source: Ambulatory Visit | Attending: Oral Surgery | Admitting: Oral Surgery

## 2023-03-20 ENCOUNTER — Other Ambulatory Visit: Payer: Self-pay

## 2023-03-20 ENCOUNTER — Ambulatory Visit (HOSPITAL_COMMUNITY): Payer: Medicaid Other | Admitting: Physician Assistant

## 2023-03-20 ENCOUNTER — Ambulatory Visit (HOSPITAL_BASED_OUTPATIENT_CLINIC_OR_DEPARTMENT_OTHER): Payer: Medicaid Other | Admitting: Physician Assistant

## 2023-03-20 ENCOUNTER — Encounter (HOSPITAL_COMMUNITY)
Admission: RE | Disposition: A | Discharge status: Home / Self Care | Payer: Self-pay | Source: Ambulatory Visit | Attending: Oral Surgery

## 2023-03-20 DIAGNOSIS — I10 Essential (primary) hypertension: Secondary | ICD-10-CM | POA: Insufficient documentation

## 2023-03-20 DIAGNOSIS — Z6841 Body Mass Index (BMI) 40.0 and over, adult: Secondary | ICD-10-CM

## 2023-03-20 DIAGNOSIS — K0850 Unsatisfactory restoration of tooth, unspecified: Secondary | ICD-10-CM | POA: Diagnosis not present

## 2023-03-20 DIAGNOSIS — K029 Dental caries, unspecified: Secondary | ICD-10-CM | POA: Insufficient documentation

## 2023-03-20 DIAGNOSIS — F319 Bipolar disorder, unspecified: Secondary | ICD-10-CM | POA: Insufficient documentation

## 2023-03-20 DIAGNOSIS — F419 Anxiety disorder, unspecified: Secondary | ICD-10-CM | POA: Diagnosis not present

## 2023-03-20 DIAGNOSIS — F17210 Nicotine dependence, cigarettes, uncomplicated: Secondary | ICD-10-CM

## 2023-03-20 HISTORY — PX: TOOTH EXTRACTION: SHX859

## 2023-03-20 LAB — POCT I-STAT, CHEM 8
BUN: 4 mg/dL — ABNORMAL LOW (ref 6–20)
Calcium, Ion: 1.09 mmol/L — ABNORMAL LOW (ref 1.15–1.40)
Chloride: 105 mmol/L (ref 98–111)
Creatinine, Ser: 0.6 mg/dL (ref 0.44–1.00)
Glucose, Bld: 91 mg/dL (ref 70–99)
HCT: 43 % (ref 36.0–46.0)
Hemoglobin: 14.6 g/dL (ref 12.0–15.0)
Potassium: 4.1 mmol/L (ref 3.5–5.1)
Sodium: 149 mmol/L — ABNORMAL HIGH (ref 135–145)
TCO2: 19 mmol/L — ABNORMAL LOW (ref 22–32)

## 2023-03-20 LAB — POCT PREGNANCY, URINE: Preg Test, Ur: NEGATIVE

## 2023-03-20 SURGERY — DENTAL RESTORATION/EXTRACTIONS
Anesthesia: General

## 2023-03-20 MED ORDER — CHLORHEXIDINE GLUCONATE 0.12 % MT SOLN: 15.0000 mL | Freq: Once | OROMUCOSAL | Status: AC

## 2023-03-20 MED ORDER — FENTANYL CITRATE (PF) 100 MCG/2ML IJ SOLN: 25.0000 ug | INTRAMUSCULAR | Status: DC | PRN

## 2023-03-20 MED ORDER — 0.9 % SODIUM CHLORIDE (POUR BTL) OPTIME
TOPICAL | Status: DC | PRN
  Administered 2023-03-20: 1000 mL

## 2023-03-20 MED ORDER — SODIUM CHLORIDE 0.9 % IR SOLN
Status: DC | PRN
  Administered 2023-03-20: 1000 mL

## 2023-03-20 MED ORDER — ONDANSETRON HCL 4 MG/2ML IJ SOLN
INTRAMUSCULAR | Status: DC | PRN
  Administered 2023-03-20: 4 mg via INTRAVENOUS

## 2023-03-20 MED ORDER — DEXAMETHASONE SODIUM PHOSPHATE 10 MG/ML IJ SOLN
INTRAMUSCULAR | Status: DC | PRN
  Administered 2023-03-20: 10 mg via INTRAVENOUS

## 2023-03-20 MED ORDER — CHLORHEXIDINE GLUCONATE 0.12 % MT SOLN
OROMUCOSAL | Status: AC
  Administered 2023-03-20: 15 mL via OROMUCOSAL
  Filled 2023-03-20: qty 15

## 2023-03-20 MED ORDER — ACETAMINOPHEN 500 MG PO TABS
1000.0000 mg | ORAL_TABLET | Freq: Once | ORAL | Status: AC
  Administered 2023-03-20: 1000 mg via ORAL
  Filled 2023-03-20: qty 2

## 2023-03-20 MED ORDER — ROCURONIUM BROMIDE 10 MG/ML (PF) SYRINGE
PREFILLED_SYRINGE | INTRAVENOUS | Status: DC | PRN
  Administered 2023-03-20: 60 mg via INTRAVENOUS

## 2023-03-20 MED ORDER — CLINDAMYCIN HCL 300 MG PO CAPS: 300.0000 mg | ORAL_CAPSULE | Freq: Three times a day (TID) | ORAL | 0 refills | Status: AC

## 2023-03-20 MED ORDER — LIDOCAINE-EPINEPHRINE 2 %-1:100000 IJ SOLN
INTRAMUSCULAR | Status: DC | PRN
  Administered 2023-03-20: 20 mL

## 2023-03-20 MED ORDER — SUGAMMADEX SODIUM 200 MG/2ML IV SOLN
INTRAVENOUS | Status: DC | PRN
  Administered 2023-03-20: 251.2 mg via INTRAVENOUS

## 2023-03-20 MED ORDER — PROPOFOL 10 MG/ML IV BOLUS
INTRAVENOUS | Status: DC | PRN
  Administered 2023-03-20: 80 mg via INTRAVENOUS
  Administered 2023-03-20: 200 mg via INTRAVENOUS

## 2023-03-20 MED ORDER — HYDROCODONE-ACETAMINOPHEN 5-325 MG PO TABS: 1.0000 | ORAL_TABLET | Freq: Four times a day (QID) | ORAL | 0 refills | Status: AC | PRN

## 2023-03-20 MED ORDER — FENTANYL CITRATE (PF) 250 MCG/5ML IJ SOLN
INTRAMUSCULAR | Status: DC | PRN
  Administered 2023-03-20: 100 ug via INTRAVENOUS
  Administered 2023-03-20: 50 ug via INTRAVENOUS

## 2023-03-20 MED ORDER — LIDOCAINE-EPINEPHRINE 2 %-1:100000 IJ SOLN
INTRAMUSCULAR | Status: AC
  Filled 2023-03-20: qty 1

## 2023-03-20 MED ORDER — ORAL CARE MOUTH RINSE: 15.0000 mL | Freq: Once | OROMUCOSAL | Status: AC

## 2023-03-20 MED ORDER — AMISULPRIDE (ANTIEMETIC) 5 MG/2ML IV SOLN
INTRAVENOUS | Status: AC
  Filled 2023-03-20: qty 4

## 2023-03-20 MED ORDER — MIDAZOLAM HCL 2 MG/2ML IJ SOLN
INTRAMUSCULAR | Status: AC
  Filled 2023-03-20: qty 2

## 2023-03-20 MED ORDER — AMISULPRIDE (ANTIEMETIC) 5 MG/2ML IV SOLN
10.0000 mg | Freq: Once | INTRAVENOUS | Status: AC | PRN
  Administered 2023-03-20: 10 mg via INTRAVENOUS

## 2023-03-20 MED ORDER — OXYMETAZOLINE HCL 0.05 % NA SOLN
NASAL | Status: AC
  Filled 2023-03-20: qty 30

## 2023-03-20 MED ORDER — CLINDAMYCIN PHOSPHATE 600 MG/50ML IV SOLN
600.0000 mg | INTRAVENOUS | Status: AC
  Administered 2023-03-20: 600 mg via INTRAVENOUS
  Filled 2023-03-20: qty 50

## 2023-03-20 MED ORDER — LIDOCAINE 2% (20 MG/ML) 5 ML SYRINGE
INTRAMUSCULAR | Status: DC | PRN
  Administered 2023-03-20: 60 mg via INTRAVENOUS

## 2023-03-20 MED ORDER — PROPOFOL 500 MG/50ML IV EMUL
INTRAVENOUS | Status: DC | PRN
  Administered 2023-03-20: 50 ug/kg/min via INTRAVENOUS

## 2023-03-20 MED ORDER — OXYCODONE HCL 5 MG PO TABS: 5.0000 mg | ORAL_TABLET | Freq: Once | ORAL | Status: DC | PRN

## 2023-03-20 MED ORDER — FENTANYL CITRATE (PF) 250 MCG/5ML IJ SOLN
INTRAMUSCULAR | Status: AC
  Filled 2023-03-20: qty 5

## 2023-03-20 MED ORDER — MIDAZOLAM HCL 2 MG/2ML IJ SOLN
INTRAMUSCULAR | Status: DC | PRN
  Administered 2023-03-20: 2 mg via INTRAVENOUS

## 2023-03-20 MED ORDER — OXYCODONE HCL 5 MG/5ML PO SOLN: 5.0000 mg | Freq: Once | ORAL | Status: DC | PRN

## 2023-03-20 MED ORDER — HEMOSTATIC AGENTS (NO CHARGE) OPTIME
TOPICAL | Status: DC | PRN
  Administered 2023-03-20: 1 via TOPICAL

## 2023-03-20 MED ORDER — LACTATED RINGERS IV SOLN: INTRAVENOUS | Status: DC

## 2023-03-20 SURGICAL SUPPLY — 36 items
BAG COUNTER SPONGE SURGICOUNT (BAG) IMPLANT
BAG SPNG CNTER NS LX DISP (BAG)
BLADE SURG 15 STRL LF DISP TIS (BLADE) ×1 IMPLANT
BLADE SURG 15 STRL SS (BLADE) ×1
BUR CROSS CUT FISSURE 1.6 (BURR) ×1 IMPLANT
BUR EGG ELITE 4.0 (BURR) ×1 IMPLANT
CANISTER SUCT 3000ML PPV (MISCELLANEOUS) ×1 IMPLANT
COVER SURGICAL LIGHT HANDLE (MISCELLANEOUS) ×1 IMPLANT
GAUZE PACKING FOLDED 2  STR (GAUZE/BANDAGES/DRESSINGS) ×1
GAUZE PACKING FOLDED 2 STR (GAUZE/BANDAGES/DRESSINGS) ×1 IMPLANT
GLOVE BIO SURGEON STRL SZ 6.5 (GLOVE) IMPLANT
GLOVE BIO SURGEON STRL SZ7 (GLOVE) IMPLANT
GLOVE BIO SURGEON STRL SZ8 (GLOVE) ×1 IMPLANT
GLOVE BIOGEL PI IND STRL 6.5 (GLOVE) IMPLANT
GLOVE BIOGEL PI IND STRL 7.0 (GLOVE) IMPLANT
GOWN STRL REUS W/ TWL LRG LVL3 (GOWN DISPOSABLE) ×1 IMPLANT
GOWN STRL REUS W/ TWL XL LVL3 (GOWN DISPOSABLE) ×1 IMPLANT
GOWN STRL REUS W/TWL LRG LVL3 (GOWN DISPOSABLE) ×1
GOWN STRL REUS W/TWL XL LVL3 (GOWN DISPOSABLE) ×1
IV NS 1000ML (IV SOLUTION) ×1
IV NS 1000ML BAXH (IV SOLUTION) ×1 IMPLANT
KIT BASIN OR (CUSTOM PROCEDURE TRAY) ×1 IMPLANT
KIT TURNOVER KIT B (KITS) ×1 IMPLANT
NDL HYPO 25GX1X1/2 BEV (NEEDLE) ×2 IMPLANT
NEEDLE HYPO 25GX1X1/2 BEV (NEEDLE) ×2 IMPLANT
NS IRRIG 1000ML POUR BTL (IV SOLUTION) ×1 IMPLANT
PAD ARMBOARD 7.5X6 YLW CONV (MISCELLANEOUS) ×1 IMPLANT
SLEEVE IRRIGATION ELITE 7 (MISCELLANEOUS) ×1 IMPLANT
SPIKE FLUID TRANSFER (MISCELLANEOUS) ×1 IMPLANT
SPONGE SURGIFOAM ABS GEL 12-7 (HEMOSTASIS) IMPLANT
SUT CHROMIC 3 0 PS 2 (SUTURE) ×1 IMPLANT
SYR BULB IRRIG 60ML STRL (SYRINGE) ×1 IMPLANT
SYR CONTROL 10ML LL (SYRINGE) ×1 IMPLANT
TRAY ENT MC OR (CUSTOM PROCEDURE TRAY) ×1 IMPLANT
TUBING IRRIGATION (MISCELLANEOUS) ×1 IMPLANT
YANKAUER SUCT BULB TIP NO VENT (SUCTIONS) ×1 IMPLANT

## 2023-03-20 NOTE — Op Note (Signed)
Jamie Huber, CRISAN MEDICAL RECORD NO: [id number] ACCOUNT NO: [id number] DATE OF BIRTH: 06/07/1986 FACILITY: [facility] LOCATION: [facility]PER[facility] PHYSICIAN: Georgia Lopes, DDS  Operative Report   DATE OF PROCEDURE: 03/20/2023  PREOPERATIVE DIAGNOSES:  Nonrestorable teeth secondary to dental caries numbers 1, 4, 12, 14, 32. Morbid obesity.  POSTOPERATIVE DIAGNOSES:  Nonrestorable teeth secondary to dental caries numbers 1, 4, 12, 14, 32. Morbid obesity.  PROCEDURE:  Extraction teeth 1, 4, 12, 14, 32.  SURGEON:  Georgia Lopes, DDS  ANESTHESIA:  General, nasal intubation, Dr. Freida Busman attending.  DESCRIPTION OF PROCEDURE:  The patient was taken to the operating room and placed on the table in supine position.  General anesthesia was administered and nasal endotracheal tube was placed and secured. The eyes were protected.  The patient was draped  for surgery.  Timeout was performed.  The posterior pharynx was suctioned and a throat pack was placed.  2% lidocaine 1:100,000 epinephrine was infiltrated buccally and palatally around the maxillary teeth and in inferior alveolar block on the lower  right side.  A bite block was placed in the mouth.  A sweetheart retractor was used to retract the tongue.  A 15 blade was used to make an incision around teeth numbers 14 and 12.  The periosteum was reflected from around these teeth.  The teeth were  elevated.  Tooth #14 was removed with the dental forceps.  Tooth #12 required removal of circumferential bone with a Stryker handpiece and fissure bur under irrigation. The tooth was elevated and removed from the mouth.  Then the sockets were curetted,  irrigated and closed with 3-0 chromic.  The bite block was repositioned to the other side of the mouth.  A 15 blade was used to make an incision around teeth numbers 1, 4 and 32.  The periosteum was reflected.  The teeth were elevated, but could not be  removed with the forceps.  The Stryker handpiece was used to  remove bone around teeth numbers 1, 4 and around tooth #32.  The teeth were then elevated and removed with the dental forceps.  The sockets were curetted, irrigated and closed with 3-0 chromic.   The oral cavity was then irrigated and suctioned.  Additional local anesthesia was administered and the patient's throat pack was removed and was left under care of anesthesia for extubation and transport to recovery and plans for discharge home  through day surgery.  ESTIMATED BLOOD LOSS:  Minimal.  COMPLICATIONS:  None.  SPECIMENS:  None.  COUNTS:  Correct.   VAI D: 03/20/2023 8:06:04 am T: 03/20/2023 8:18:00 am  JOB: [id number]/ [id number]

## 2023-03-20 NOTE — H&P (Signed)
H&P documentation  -History and Physical Reviewed  -Patient has been re-examined  -No change in the plan of care  Jamie Huber  

## 2023-03-20 NOTE — Transfer of Care (Signed)
Immediate Anesthesia Transfer of Care Note  Patient: Jamie Huber  Procedure(s) Performed: DENTAL RESTORATION/EXTRACTIONS  Patient Location: PACU  Anesthesia Type:General  Level of Consciousness: awake  Airway & Oxygen Therapy: Patient Spontanous Breathing and Patient connected to nasal cannula oxygen  Post-op Assessment: Report given to RN and Post -op Vital signs reviewed and stable  Post vital signs: Reviewed and stable  Last Vitals:  Vitals Value Taken Time  BP 123/69 03/20/23 0818  Temp 98   Pulse 76 03/20/23 0819  Resp 19 03/20/23 0819  SpO2 94 % 03/20/23 0819  Vitals shown include unvalidated device data.  Last Pain:  Vitals:   03/20/23 0654  TempSrc:   PainSc: 0-No pain         Complications: No notable events documented.

## 2023-03-20 NOTE — Op Note (Signed)
03/20/2023  8:02 AM  PATIENT:  Jamie Huber  37 y.o. female  PRE-OPERATIVE DIAGNOSIS:  NON-RESTORABLE TEETH SECONDARY TO DENTAL CARIES #1, 4, 12, 14, 32,   POST-OPERATIVE DIAGNOSIS:  SAME  PROCEDURE:  Procedure(s): EXTRACTION TEETH  #1, 4, 12, 14, 32  SURGEON:  Surgeon(s): Barbette Merino, Acupuncturist, DMD  ANESTHESIA:   local and general  EBL:  minimal  DRAINS: none   SPECIMEN:  No Specimen  COUNTS:  YES  PLAN OF CARE: Discharge to home after PACU  PATIENT DISPOSITION:  PACU - hemodynamically stable.   PROCEDURE DETAILS: Dictation # [id number]  Georgia Lopes, DMD 03/20/2023 8:02 AM

## 2023-03-20 NOTE — Anesthesia Postprocedure Evaluation (Signed)
Anesthesia Post Note  Patient: Jamie Huber  Procedure(s) Performed: DENTAL RESTORATION/EXTRACTIONS     Patient location during evaluation: PACU Anesthesia Type: General Level of consciousness: awake Pain management: pain level controlled Vital Signs Assessment: post-procedure vital signs reviewed and stable Respiratory status: spontaneous breathing, nonlabored ventilation and respiratory function stable Cardiovascular status: blood pressure returned to baseline and stable Postop Assessment: no apparent nausea or vomiting Anesthetic complications: no   No notable events documented.  Last Vitals:  Vitals:   03/20/23 0845 03/20/23 0853  BP: 116/81 (!) 124/96  Pulse: (!) 57 61  Resp: 16 14  Temp:  36.7 C  SpO2: 95% 96%    Last Pain:  Vitals:   03/20/23 0815  TempSrc:   PainSc: Asleep                 Linton Rump

## 2023-03-20 NOTE — Anesthesia Procedure Notes (Signed)
Procedure Name: Intubation Date/Time: 03/20/2023 7:30 AM  Performed by: Darryl Nestle, CRNAPre-anesthesia Checklist: Patient identified, Emergency Drugs available, Suction available and Patient being monitored Patient Re-evaluated:Patient Re-evaluated prior to induction Oxygen Delivery Method: Circle system utilized Preoxygenation: Pre-oxygenation with 100% oxygen Induction Type: IV induction Ventilation: Mask ventilation without difficulty Laryngoscope Size: Mac and 3 Grade View: Grade III Nasal Tubes: Nasal prep performed, Nasal Rae and Left Tube size: 7.0 mm Placement Confirmation: ETT inserted through vocal cords under direct vision, positive ETCO2 and breath sounds checked- equal and bilateral Tube secured with: Tape Dental Injury: Teeth and Oropharynx as per pre-operative assessment

## 2023-03-21 ENCOUNTER — Encounter (HOSPITAL_COMMUNITY): Payer: Self-pay | Admitting: Oral Surgery
